# Patient Record
Sex: Male | Born: 1935 | Race: White | Hispanic: No | Marital: Married | Smoking: Never smoker
Health system: Southern US, Community
[De-identification: ages and names within clinical notes are randomized; demographics above are authoritative.]

## PROBLEM LIST (undated history)

## (undated) DIAGNOSIS — R7303 Prediabetes: Secondary | ICD-10-CM

## (undated) DIAGNOSIS — I10 Essential (primary) hypertension: Secondary | ICD-10-CM

## (undated) DIAGNOSIS — N183 Chronic kidney disease, stage 3 unspecified: Secondary | ICD-10-CM

## (undated) DIAGNOSIS — N400 Enlarged prostate without lower urinary tract symptoms: Secondary | ICD-10-CM

## (undated) HISTORY — PX: NASAL SEPTUM SURGERY: SHX37

## (undated) HISTORY — PX: TONSILLECTOMY: SUR1361

---

## 2016-01-18 ENCOUNTER — Other Ambulatory Visit: Payer: Self-pay | Admitting: Orthopedic Surgery

## 2016-04-07 NOTE — Pre-Procedure Instructions (Addendum)
Douglas Huffman  04/07/2016     No Pharmacies Listed   Your procedure is scheduled on 04/21/16  Report to [facility] at [phone].  Call this number if you have problems the morning of surgery:  [phone]   Remember:  Do not eat food or drink liquids after midnight.  Take these medicines the morning of surgery with A SIP OF WATER : NONE  STOP all herbel meds, nsaids (aleve,naproxen,advil,ibuprofen)7 days prior to surgery(04/14/16) including aspirin ,vitamins   Do not wear jewelry, make-up or nail polish.  Do not wear lotions, powders, or perfumes, or deoderant.  Do not shave 48 hours prior to surgery.  Men may shave face and neck.  Do not bring valuables to the hospital.  [facility] is not responsible for any belongings or valuables.  Contacts, dentures or bridgework may not be worn into surgery.  Leave your suitcase in the car.  After surgery it may be brought to your room.  For patients admitted to the hospital, discharge time will be determined by your treatment team.  Patients discharged the day of surgery will not be allowed to drive home.   Name and phone number of your driver:    Special instructions:  Special Instructions: [facility] - Preparing for Surgery  Before surgery, you can play an important role.  Because skin is not sterile, your skin needs to be as free of germs as possible.  You can reduce the number of germs on you skin by washing with CHG (chlorahexidine gluconate) soap before surgery.  CHG is an antiseptic cleaner which kills germs and bonds with the skin to continue killing germs even after washing.  Please DO NOT use if you have an allergy to CHG or antibacterial soaps.  If your skin becomes reddened/irritated stop using the CHG and inform your nurse when you arrive at Short Stay.  Do not shave (including legs and underarms) for at least 48 hours prior to the first CHG shower.  You may shave your face.  Please follow these  instructions carefully:   1.  Shower with CHG Soap the night before surgery and the morning of Surgery.  2.  If you choose to wash your hair, wash your hair first as usual with your normal shampoo.  3.  After you shampoo, rinse your hair and body thoroughly to remove the Shampoo.  4.  Use CHG as you would any other liquid soap.  You can apply chg directly  to the skin and wash gently with scrungie or a clean washcloth.  5.  Apply the CHG Soap to your body ONLY FROM THE NECK DOWN.  Do not use on open wounds or open sores.  Avoid contact with your eyes ears, mouth and genitals (private parts).  Wash genitals (private parts)       with your normal soap.  6.  Wash thoroughly, paying special attention to the area where your surgery will be performed.  7.  Thoroughly rinse your body with warm water from the neck down.  8.  DO NOT shower/wash with your normal soap after using and rinsing off the CHG Soap.  9.  Pat yourself dry with a clean towel.            10.  Wear clean pajamas.            11.  Place clean sheets on your bed the night of your first shower and do not sleep with pets.  Day of Surgery  Do not apply any lotions/deodorants the morning of surgery.  Please wear clean clothes to the hospital/surgery center.  Please read over the  fact sheets that you were given.

## 2016-04-08 ENCOUNTER — Encounter (HOSPITAL_COMMUNITY)
Admission: RE | Admit: 2016-04-08 | Discharge: 2016-04-08 | Disposition: A | Discharge status: Home / Self Care | Payer: Medicare Other | Source: Ambulatory Visit | Attending: Orthopedic Surgery | Admitting: Orthopedic Surgery

## 2016-04-08 ENCOUNTER — Ambulatory Visit (HOSPITAL_COMMUNITY)
Admission: RE | Admit: 2016-04-08 | Discharge: 2016-04-08 | Disposition: A | Discharge status: Home / Self Care | Payer: Medicare Other | Source: Ambulatory Visit | Attending: Orthopedic Surgery | Admitting: Orthopedic Surgery

## 2016-04-08 ENCOUNTER — Encounter (HOSPITAL_COMMUNITY): Payer: Self-pay

## 2016-04-08 DIAGNOSIS — Z01810 Encounter for preprocedural cardiovascular examination: Secondary | ICD-10-CM | POA: Diagnosis not present

## 2016-04-08 DIAGNOSIS — I129 Hypertensive chronic kidney disease with stage 1 through stage 4 chronic kidney disease, or unspecified chronic kidney disease: Secondary | ICD-10-CM | POA: Diagnosis not present

## 2016-04-08 DIAGNOSIS — I10 Essential (primary) hypertension: Secondary | ICD-10-CM | POA: Diagnosis not present

## 2016-04-08 DIAGNOSIS — N183 Chronic kidney disease, stage 3 (moderate): Secondary | ICD-10-CM | POA: Diagnosis not present

## 2016-04-08 DIAGNOSIS — R001 Bradycardia, unspecified: Secondary | ICD-10-CM | POA: Insufficient documentation

## 2016-04-08 DIAGNOSIS — Z01818 Encounter for other preprocedural examination: Secondary | ICD-10-CM | POA: Diagnosis present

## 2016-04-08 DIAGNOSIS — E1122 Type 2 diabetes mellitus with diabetic chronic kidney disease: Secondary | ICD-10-CM | POA: Insufficient documentation

## 2016-04-08 HISTORY — DX: Essential (primary) hypertension: I10

## 2016-04-08 HISTORY — DX: Chronic kidney disease, stage 3 (moderate): N18.3

## 2016-04-08 HISTORY — DX: Chronic kidney disease, stage 3 unspecified: N18.30

## 2016-04-08 HISTORY — DX: Prediabetes: R73.03

## 2016-04-08 HISTORY — DX: Benign prostatic hyperplasia without lower urinary tract symptoms: N40.0

## 2016-04-08 LAB — COMPREHENSIVE METABOLIC PANEL
ALBUMIN: 4.1 g/dL (ref 3.5–5.0)
ALK PHOS: 78 U/L (ref 38–126)
ALT: 14 U/L — ABNORMAL LOW (ref 17–63)
ANION GAP: 7 (ref 5–15)
AST: 16 U/L (ref 15–41)
BUN: 21 mg/dL — ABNORMAL HIGH (ref 6–20)
CO2: 26 mmol/L (ref 22–32)
Calcium: 9.5 mg/dL (ref 8.9–10.3)
Chloride: 106 mmol/L (ref 101–111)
Creatinine, Ser: 1.6 mg/dL — ABNORMAL HIGH (ref 0.61–1.24)
GFR calc Af Amer: 45 mL/min — ABNORMAL LOW (ref >60)
GFR calc non Af Amer: 39 mL/min — ABNORMAL LOW (ref >60)
GLUCOSE: 137 mg/dL — AB (ref 65–99)
POTASSIUM: 5 mmol/L (ref 3.5–5.1)
SODIUM: 139 mmol/L (ref 135–145)
Total Bilirubin: 1.3 mg/dL — ABNORMAL HIGH (ref 0.3–1.2)
Total Protein: 6.7 g/dL (ref 6.5–8.1)

## 2016-04-08 LAB — CBC WITH DIFFERENTIAL/PLATELET
BASOS ABS: 0 10*3/uL (ref 0.0–0.1)
BASOS PCT: 1 %
EOS ABS: 0.2 10*3/uL (ref 0.0–0.7)
Eosinophils Relative: 3 %
HCT: 48.2 % (ref 39.0–52.0)
HEMOGLOBIN: 16.5 g/dL (ref 13.0–17.0)
Lymphocytes Relative: 35 %
Lymphs Abs: 2.1 10*3/uL (ref 0.7–4.0)
MCH: 30.8 pg (ref 26.0–34.0)
MCHC: 34.2 g/dL (ref 30.0–36.0)
MCV: 89.9 fL (ref 78.0–100.0)
MONOS PCT: 10 %
Monocytes Absolute: 0.6 10*3/uL (ref 0.1–1.0)
NEUTROS PCT: 51 %
Neutro Abs: 3.2 10*3/uL (ref 1.7–7.7)
Platelets: 195 10*3/uL (ref 150–400)
RBC: 5.36 MIL/uL (ref 4.22–5.81)
RDW: 13.3 % (ref 11.5–15.5)
WBC: 6.1 10*3/uL (ref 4.0–10.5)

## 2016-04-08 LAB — URINALYSIS, ROUTINE W REFLEX MICROSCOPIC
Bilirubin Urine: NEGATIVE
Glucose, UA: NEGATIVE mg/dL
Hgb urine dipstick: NEGATIVE
Ketones, ur: NEGATIVE mg/dL
Leukocytes, UA: NEGATIVE
NITRITE: NEGATIVE
Protein, ur: NEGATIVE mg/dL
SPECIFIC GRAVITY, URINE: 1.015 (ref 1.005–1.030)
pH: 6 (ref 5.0–8.0)

## 2016-04-08 LAB — PROTIME-INR
INR: 1.08
Prothrombin Time: 14 seconds (ref 11.4–15.2)

## 2016-04-08 LAB — SURGICAL PCR SCREEN
MRSA, PCR: NEGATIVE
STAPHYLOCOCCUS AUREUS: POSITIVE — AB

## 2016-04-08 MED ORDER — CHLORHEXIDINE GLUCONATE 4 % EX LIQD: 60.0000 mL | Freq: Once | CUTANEOUS | Status: DC

## 2016-04-08 NOTE — Progress Notes (Addendum)
Anesthesia Chart Review:  Pt is an 82 year old male scheduled for R total knee arthroplasty on 04/21/2016 with Vickey Huger, MD.   - PCP is Antony Contras, MD who cleared pt for surgery at last office visit 01/14/16.   PMH includes:  HTN, pre-diabetes, CKD (stage 3). Never smoker. BMI 28.5  Preoperative labs reviewed.  Cr 1.6, BUN 21. Consistent with prior results from PCP's office 10/25/15: Cr 1.45, BUN 25.   CXR 04/08/16: No active cardiopulmonary disease.  EKG 04/08/16: Sinus bradycardia (49 bpm).   Willeen Cass, FNP-BC Physician [facility] Short Stay Surgical Center/Anesthesiology Phone: [phone] 04/10/2016 3:38 PM

## 2016-04-18 MED ORDER — TRANEXAMIC ACID 1000 MG/10ML IV SOLN
1000.0000 mg | INTRAVENOUS | Status: AC
  Administered 2016-04-21: 1000 mg via INTRAVENOUS
  Filled 2016-04-18: qty 10

## 2016-04-21 ENCOUNTER — Encounter (HOSPITAL_COMMUNITY): Payer: Self-pay | Admitting: *Deleted

## 2016-04-21 ENCOUNTER — Inpatient Hospital Stay (HOSPITAL_COMMUNITY): Payer: Medicare Other | Admitting: Vascular Surgery

## 2016-04-21 ENCOUNTER — Inpatient Hospital Stay (HOSPITAL_COMMUNITY)
Admission: RE | Admit: 2016-04-21 | Discharge: 2016-04-23 | DRG: 470 | Disposition: A | Discharge status: Home Health | Payer: Medicare Other | Source: Ambulatory Visit | Attending: Orthopedic Surgery | Admitting: Orthopedic Surgery

## 2016-04-21 ENCOUNTER — Encounter (HOSPITAL_COMMUNITY)
Admission: RE | Disposition: A | Discharge status: Home Health | Payer: Self-pay | Source: Ambulatory Visit | Attending: Orthopedic Surgery

## 2016-04-21 ENCOUNTER — Inpatient Hospital Stay (HOSPITAL_COMMUNITY): Payer: Medicare Other | Admitting: Anesthesiology

## 2016-04-21 DIAGNOSIS — Z7982 Long term (current) use of aspirin: Secondary | ICD-10-CM

## 2016-04-21 DIAGNOSIS — R338 Other retention of urine: Secondary | ICD-10-CM | POA: Diagnosis not present

## 2016-04-21 DIAGNOSIS — N183 Chronic kidney disease, stage 3 (moderate): Secondary | ICD-10-CM | POA: Diagnosis present

## 2016-04-21 DIAGNOSIS — Z96659 Presence of unspecified artificial knee joint: Secondary | ICD-10-CM

## 2016-04-21 DIAGNOSIS — Z885 Allergy status to narcotic agent status: Secondary | ICD-10-CM | POA: Diagnosis not present

## 2016-04-21 DIAGNOSIS — R7303 Prediabetes: Secondary | ICD-10-CM | POA: Diagnosis present

## 2016-04-21 DIAGNOSIS — N401 Enlarged prostate with lower urinary tract symptoms: Secondary | ICD-10-CM | POA: Diagnosis present

## 2016-04-21 DIAGNOSIS — I129 Hypertensive chronic kidney disease with stage 1 through stage 4 chronic kidney disease, or unspecified chronic kidney disease: Secondary | ICD-10-CM | POA: Diagnosis present

## 2016-04-21 DIAGNOSIS — M1711 Unilateral primary osteoarthritis, right knee: Principal | ICD-10-CM | POA: Diagnosis present

## 2016-04-21 DIAGNOSIS — M25561 Pain in right knee: Secondary | ICD-10-CM | POA: Diagnosis present

## 2016-04-21 HISTORY — PX: TOTAL KNEE ARTHROPLASTY: SHX125

## 2016-04-21 LAB — GLUCOSE, CAPILLARY: Glucose-Capillary: 109 mg/dL — ABNORMAL HIGH (ref 65–99)

## 2016-04-21 SURGERY — ARTHROPLASTY, KNEE, TOTAL
Anesthesia: Spinal | Site: Knee | Laterality: Right

## 2016-04-21 MED ORDER — PROPOFOL 500 MG/50ML IV EMUL
INTRAVENOUS | Status: DC | PRN
  Administered 2016-04-21: 75 ug/kg/min via INTRAVENOUS

## 2016-04-21 MED ORDER — PROPOFOL 500 MG/50ML IV EMUL
INTRAVENOUS | Status: AC
  Filled 2016-04-21: qty 50

## 2016-04-21 MED ORDER — SENNOSIDES-DOCUSATE SODIUM 8.6-50 MG PO TABS: 1.0000 | ORAL_TABLET | Freq: Every evening | ORAL | Status: DC | PRN

## 2016-04-21 MED ORDER — METHOCARBAMOL 500 MG PO TABS
500.0000 mg | ORAL_TABLET | Freq: Four times a day (QID) | ORAL | Status: DC | PRN
  Administered 2016-04-22: 500 mg via ORAL
  Filled 2016-04-21: qty 1

## 2016-04-21 MED ORDER — PROPOFOL 10 MG/ML IV BOLUS
INTRAVENOUS | Status: AC
  Filled 2016-04-21: qty 20

## 2016-04-21 MED ORDER — BUPIVACAINE HCL (PF) 0.25 % IJ SOLN
INTRAMUSCULAR | Status: AC
  Filled 2016-04-21: qty 30

## 2016-04-21 MED ORDER — ONDANSETRON HCL 4 MG/2ML IJ SOLN: 4.0000 mg | Freq: Four times a day (QID) | INTRAMUSCULAR | Status: DC | PRN

## 2016-04-21 MED ORDER — FENTANYL CITRATE (PF) 100 MCG/2ML IJ SOLN
INTRAMUSCULAR | Status: DC | PRN
  Administered 2016-04-21: 100 ug via INTRAVENOUS

## 2016-04-21 MED ORDER — MIDAZOLAM HCL 5 MG/5ML IJ SOLN
INTRAMUSCULAR | Status: DC | PRN
  Administered 2016-04-21: 2 mg via INTRAVENOUS

## 2016-04-21 MED ORDER — FLEET ENEMA 7-19 GM/118ML RE ENEM: 1.0000 | ENEMA | Freq: Once | RECTAL | Status: DC | PRN

## 2016-04-21 MED ORDER — ONDANSETRON HCL 4 MG/2ML IJ SOLN: 4.0000 mg | Freq: Once | INTRAMUSCULAR | Status: DC | PRN

## 2016-04-21 MED ORDER — HYDROMORPHONE HCL 2 MG/ML IJ SOLN
1.0000 mg | INTRAMUSCULAR | Status: DC | PRN
Start: 2016-04-21 — End: 2016-04-23

## 2016-04-21 MED ORDER — TRANEXAMIC ACID 1000 MG/10ML IV SOLN
1000.0000 mg | Freq: Once | INTRAVENOUS | Status: AC
  Administered 2016-04-21: 1000 mg via INTRAVENOUS
  Filled 2016-04-21: qty 10

## 2016-04-21 MED ORDER — CEFAZOLIN SODIUM-DEXTROSE 2-4 GM/100ML-% IV SOLN
2.0000 g | INTRAVENOUS | Status: AC
  Administered 2016-04-21: 2 g via INTRAVENOUS
  Filled 2016-04-21: qty 100

## 2016-04-21 MED ORDER — DIPHENHYDRAMINE HCL 12.5 MG/5ML PO ELIX: 12.5000 mg | ORAL_SOLUTION | ORAL | Status: DC | PRN

## 2016-04-21 MED ORDER — ALUM & MAG HYDROXIDE-SIMETH 200-200-20 MG/5ML PO SUSP
30.0000 mL | ORAL | Status: DC | PRN
  Administered 2016-04-22: 30 mL via ORAL
  Filled 2016-04-21: qty 30

## 2016-04-21 MED ORDER — ACETAMINOPHEN 325 MG PO TABS
650.0000 mg | ORAL_TABLET | Freq: Four times a day (QID) | ORAL | Status: DC | PRN
  Administered 2016-04-23: 650 mg via ORAL
  Filled 2016-04-21: qty 2

## 2016-04-21 MED ORDER — ACETAMINOPHEN 500 MG PO TABS
1000.0000 mg | ORAL_TABLET | Freq: Once | ORAL | Status: AC
  Administered 2016-04-21: 1000 mg via ORAL
  Filled 2016-04-21: qty 2

## 2016-04-21 MED ORDER — METOCLOPRAMIDE HCL 5 MG/ML IJ SOLN: 5.0000 mg | Freq: Three times a day (TID) | INTRAMUSCULAR | Status: DC | PRN

## 2016-04-21 MED ORDER — BUPIVACAINE-EPINEPHRINE 0.5% -1:200000 IJ SOLN
INTRAMUSCULAR | Status: DC | PRN
  Administered 2016-04-21: 20 mL

## 2016-04-21 MED ORDER — FENTANYL CITRATE (PF) 100 MCG/2ML IJ SOLN
INTRAMUSCULAR | Status: AC
  Filled 2016-04-21: qty 2

## 2016-04-21 MED ORDER — DEXAMETHASONE SODIUM PHOSPHATE 10 MG/ML IJ SOLN
INTRAMUSCULAR | Status: DC | PRN
  Administered 2016-04-21: 4 mg via INTRAVENOUS

## 2016-04-21 MED ORDER — LOSARTAN POTASSIUM 50 MG PO TABS
100.0000 mg | ORAL_TABLET | Freq: Every day | ORAL | Status: DC
  Administered 2016-04-21 – 2016-04-23 (×3): 100 mg via ORAL
  Filled 2016-04-21 (×3): qty 2

## 2016-04-21 MED ORDER — ASPIRIN EC 325 MG PO TBEC
325.0000 mg | DELAYED_RELEASE_TABLET | Freq: Two times a day (BID) | ORAL | Status: DC
  Administered 2016-04-21 – 2016-04-23 (×4): 325 mg via ORAL
  Filled 2016-04-21 (×5): qty 1

## 2016-04-21 MED ORDER — MIDAZOLAM HCL 2 MG/2ML IJ SOLN
INTRAMUSCULAR | Status: AC
  Filled 2016-04-21: qty 2

## 2016-04-21 MED ORDER — OXYCODONE HCL ER 10 MG PO T12A
10.0000 mg | EXTENDED_RELEASE_TABLET | Freq: Two times a day (BID) | ORAL | Status: DC
  Administered 2016-04-21 – 2016-04-22 (×2): 10 mg via ORAL
  Filled 2016-04-21 (×5): qty 1

## 2016-04-21 MED ORDER — CELECOXIB 200 MG PO CAPS
200.0000 mg | ORAL_CAPSULE | Freq: Two times a day (BID) | ORAL | Status: DC
  Administered 2016-04-21: 200 mg via ORAL
  Filled 2016-04-21 (×5): qty 1

## 2016-04-21 MED ORDER — LIDOCAINE 2% (20 MG/ML) 5 ML SYRINGE
INTRAMUSCULAR | Status: AC
  Filled 2016-04-21: qty 5

## 2016-04-21 MED ORDER — ONDANSETRON HCL 4 MG PO TABS: 4.0000 mg | ORAL_TABLET | Freq: Four times a day (QID) | ORAL | Status: DC | PRN

## 2016-04-21 MED ORDER — SODIUM CHLORIDE 0.9 % IJ SOLN
INTRAMUSCULAR | Status: DC | PRN
  Administered 2016-04-21: 20 mL

## 2016-04-21 MED ORDER — HYDROMORPHONE HCL 1 MG/ML IJ SOLN: 0.2500 mg | INTRAMUSCULAR | Status: DC | PRN

## 2016-04-21 MED ORDER — ONDANSETRON HCL 4 MG/2ML IJ SOLN
INTRAMUSCULAR | Status: DC | PRN
  Administered 2016-04-21: 4 mg via INTRAVENOUS

## 2016-04-21 MED ORDER — DOCUSATE SODIUM 100 MG PO CAPS
100.0000 mg | ORAL_CAPSULE | Freq: Two times a day (BID) | ORAL | Status: DC
  Administered 2016-04-21 – 2016-04-22 (×3): 100 mg via ORAL
  Filled 2016-04-21 (×5): qty 1

## 2016-04-21 MED ORDER — ACETAMINOPHEN 650 MG RE SUPP: 650.0000 mg | Freq: Four times a day (QID) | RECTAL | Status: DC | PRN

## 2016-04-21 MED ORDER — CEFAZOLIN IN D5W 1 GM/50ML IV SOLN
1.0000 g | Freq: Four times a day (QID) | INTRAVENOUS | Status: AC
  Administered 2016-04-21 (×2): 1 g via INTRAVENOUS
  Filled 2016-04-21 (×2): qty 50

## 2016-04-21 MED ORDER — OXYCODONE HCL 5 MG PO TABS
5.0000 mg | ORAL_TABLET | ORAL | Status: DC | PRN
  Administered 2016-04-21 – 2016-04-22 (×2): 10 mg via ORAL
  Filled 2016-04-21 (×3): qty 2

## 2016-04-21 MED ORDER — TAMSULOSIN HCL 0.4 MG PO CAPS
0.4000 mg | ORAL_CAPSULE | Freq: Every day | ORAL | Status: DC
  Administered 2016-04-21 – 2016-04-23 (×3): 0.4 mg via ORAL
  Filled 2016-04-21 (×3): qty 1

## 2016-04-21 MED ORDER — BUPIVACAINE LIPOSOME 1.3 % IJ SUSP
INTRAMUSCULAR | Status: DC | PRN
  Administered 2016-04-21: 20 mL

## 2016-04-21 MED ORDER — ZOLPIDEM TARTRATE 5 MG PO TABS: 5.0000 mg | ORAL_TABLET | Freq: Every evening | ORAL | Status: DC | PRN

## 2016-04-21 MED ORDER — BUPIVACAINE LIPOSOME 1.3 % IJ SUSP
20.0000 mL | INTRAMUSCULAR | Status: DC
  Filled 2016-04-21: qty 20

## 2016-04-21 MED ORDER — DEXAMETHASONE SODIUM PHOSPHATE 10 MG/ML IJ SOLN
10.0000 mg | Freq: Once | INTRAMUSCULAR | Status: AC
  Administered 2016-04-22: 10 mg via INTRAVENOUS
  Filled 2016-04-21: qty 1

## 2016-04-21 MED ORDER — SODIUM CHLORIDE 0.9 % IV SOLN
INTRAVENOUS | Status: DC
  Administered 2016-04-21 – 2016-04-22 (×2): via INTRAVENOUS

## 2016-04-21 MED ORDER — BISACODYL 5 MG PO TBEC
5.0000 mg | DELAYED_RELEASE_TABLET | Freq: Every day | ORAL | Status: DC | PRN
Start: 2016-04-21 — End: 2016-04-23

## 2016-04-21 MED ORDER — GABAPENTIN 300 MG PO CAPS
300.0000 mg | ORAL_CAPSULE | Freq: Three times a day (TID) | ORAL | Status: DC
  Administered 2016-04-21 – 2016-04-22 (×3): 300 mg via ORAL
  Filled 2016-04-21 (×6): qty 1

## 2016-04-21 MED ORDER — SODIUM CHLORIDE 0.9 % IV SOLN
INTRAVENOUS | Status: DC
  Administered 2016-04-21: 07:00:00 via INTRAVENOUS

## 2016-04-21 MED ORDER — SODIUM CHLORIDE 0.9 % IR SOLN
Status: DC | PRN
  Administered 2016-04-21: 3000 mL

## 2016-04-21 MED ORDER — EPHEDRINE SULFATE 50 MG/ML IJ SOLN
INTRAMUSCULAR | Status: DC | PRN
  Administered 2016-04-21: 10 mg via INTRAVENOUS
  Administered 2016-04-21: 5 mg via INTRAVENOUS

## 2016-04-21 MED ORDER — PROPOFOL 1000 MG/100ML IV EMUL
INTRAVENOUS | Status: AC
  Filled 2016-04-21: qty 100

## 2016-04-21 MED ORDER — MEPERIDINE HCL 25 MG/ML IJ SOLN: 6.2500 mg | INTRAMUSCULAR | Status: DC | PRN

## 2016-04-21 MED ORDER — MENTHOL 3 MG MT LOZG: 1.0000 | LOZENGE | OROMUCOSAL | Status: DC | PRN

## 2016-04-21 MED ORDER — METHOCARBAMOL 1000 MG/10ML IJ SOLN
500.0000 mg | Freq: Four times a day (QID) | INTRAVENOUS | Status: DC | PRN
  Filled 2016-04-21: qty 5

## 2016-04-21 MED ORDER — PHENOL 1.4 % MT LIQD: 1.0000 | OROMUCOSAL | Status: DC | PRN

## 2016-04-21 MED ORDER — ONDANSETRON HCL 4 MG/2ML IJ SOLN
INTRAMUSCULAR | Status: AC
  Filled 2016-04-21: qty 2

## 2016-04-21 MED ORDER — PROPOFOL 10 MG/ML IV BOLUS
INTRAVENOUS | Status: DC | PRN
  Administered 2016-04-21: 20 mg via INTRAVENOUS

## 2016-04-21 MED ORDER — EPHEDRINE 5 MG/ML INJ
INTRAVENOUS | Status: AC
  Filled 2016-04-21: qty 10

## 2016-04-21 MED ORDER — METOCLOPRAMIDE HCL 5 MG PO TABS: 5.0000 mg | ORAL_TABLET | Freq: Three times a day (TID) | ORAL | Status: DC | PRN

## 2016-04-21 MED ORDER — DEXAMETHASONE SODIUM PHOSPHATE 10 MG/ML IJ SOLN
INTRAMUSCULAR | Status: AC
  Filled 2016-04-21: qty 1

## 2016-04-21 MED ORDER — DUTASTERIDE 0.5 MG PO CAPS
0.5000 mg | ORAL_CAPSULE | Freq: Every day | ORAL | Status: DC
  Administered 2016-04-21 – 2016-04-23 (×3): 0.5 mg via ORAL
  Filled 2016-04-21 (×3): qty 1

## 2016-04-21 SURGICAL SUPPLY — 62 items
BANDAGE ACE 6X5 VEL STRL LF (GAUZE/BANDAGES/DRESSINGS) ×3 IMPLANT
BANDAGE ESMARK 6X9 LF (GAUZE/BANDAGES/DRESSINGS) ×1 IMPLANT
BLADE SAGITTAL 13X1.27X60 (BLADE) ×2 IMPLANT
BLADE SAGITTAL 13X1.27X60MM (BLADE) ×1
BLADE SAW SGTL 83.5X18.5 (BLADE) ×3 IMPLANT
BLADE SURG 10 STRL SS (BLADE) ×3 IMPLANT
BNDG ELASTIC 6X10 VLCR STRL LF (GAUZE/BANDAGES/DRESSINGS) ×3 IMPLANT
BNDG ESMARK 6X9 LF (GAUZE/BANDAGES/DRESSINGS) ×3
BOWL SMART MIX CTS (DISPOSABLE) ×3 IMPLANT
CAPT KNEE TOTAL 3 ×3 IMPLANT
CEMENT BONE SIMPLEX SPEEDSET (Cement) ×6 IMPLANT
CLOSURE WOUND 1/2 X4 (GAUZE/BANDAGES/DRESSINGS) ×1
COVER SURGICAL LIGHT HANDLE (MISCELLANEOUS) ×3 IMPLANT
CUFF TOURNIQUET SINGLE 34IN LL (TOURNIQUET CUFF) ×3 IMPLANT
DRAPE HALF SHEET 40X57 (DRAPES) ×3 IMPLANT
DRAPE INCISE IOBAN 66X45 STRL (DRAPES) ×6 IMPLANT
DRAPE PROXIMA HALF (DRAPES) IMPLANT
DRAPE U-SHAPE 47X51 STRL (DRAPES) ×3 IMPLANT
DRSG AQUACEL AG ADV 3.5X10 (GAUZE/BANDAGES/DRESSINGS) ×3 IMPLANT
DRSG PAD ABDOMINAL 8X10 ST (GAUZE/BANDAGES/DRESSINGS) ×3 IMPLANT
DURAPREP 26ML APPLICATOR (WOUND CARE) ×6 IMPLANT
ELECT REM PT RETURN 9FT ADLT (ELECTROSURGICAL) ×3
ELECTRODE REM PT RTRN 9FT ADLT (ELECTROSURGICAL) ×1 IMPLANT
GLOVE BIOGEL M 7.0 STRL (GLOVE) IMPLANT
GLOVE BIOGEL PI IND STRL 7.5 (GLOVE) IMPLANT
GLOVE BIOGEL PI IND STRL 8.5 (GLOVE) ×5 IMPLANT
GLOVE BIOGEL PI INDICATOR 7.5 (GLOVE)
GLOVE BIOGEL PI INDICATOR 8.5 (GLOVE) ×10
GLOVE SURG ORTHO 8.0 STRL STRW (GLOVE) ×18 IMPLANT
GOWN STRL REUS W/ TWL LRG LVL3 (GOWN DISPOSABLE) ×1 IMPLANT
GOWN STRL REUS W/ TWL XL LVL3 (GOWN DISPOSABLE) ×2 IMPLANT
GOWN STRL REUS W/TWL 2XL LVL3 (GOWN DISPOSABLE) ×3 IMPLANT
GOWN STRL REUS W/TWL LRG LVL3 (GOWN DISPOSABLE) ×2
GOWN STRL REUS W/TWL XL LVL3 (GOWN DISPOSABLE) ×4
HANDPIECE INTERPULSE COAX TIP (DISPOSABLE) ×2
HOOD PEEL AWAY FACE SHEILD DIS (HOOD) ×9 IMPLANT
KIT BASIN OR (CUSTOM PROCEDURE TRAY) ×3 IMPLANT
KIT ROOM TURNOVER OR (KITS) ×3 IMPLANT
KNEE CAPITATED TOTAL 3 ×1 IMPLANT
MANIFOLD NEPTUNE II (INSTRUMENTS) ×3 IMPLANT
NEEDLE 22X1 1/2 (OR ONLY) (NEEDLE) ×6 IMPLANT
NS IRRIG 1000ML POUR BTL (IV SOLUTION) ×3 IMPLANT
PACK TOTAL JOINT (CUSTOM PROCEDURE TRAY) ×3 IMPLANT
PACK UNIVERSAL I (CUSTOM PROCEDURE TRAY) ×3 IMPLANT
PAD ARMBOARD 7.5X6 YLW CONV (MISCELLANEOUS) ×6 IMPLANT
SET HNDPC FAN SPRY TIP SCT (DISPOSABLE) ×1 IMPLANT
STAPLER VISISTAT 35W (STAPLE) ×3 IMPLANT
STRIP CLOSURE SKIN 1/2X4 (GAUZE/BANDAGES/DRESSINGS) ×2 IMPLANT
SUCTION FRAZIER HANDLE 10FR (MISCELLANEOUS) ×2
SUCTION TUBE FRAZIER 10FR DISP (MISCELLANEOUS) ×1 IMPLANT
SUT BONE WAX W31G (SUTURE) ×3 IMPLANT
SUT VIC AB 0 CTB1 27 (SUTURE) ×6 IMPLANT
SUT VIC AB 1 CT1 27 (SUTURE) ×4
SUT VIC AB 1 CT1 27XBRD ANBCTR (SUTURE) ×2 IMPLANT
SUT VIC AB 2-0 CT1 27 (SUTURE) ×4
SUT VIC AB 2-0 CT1 TAPERPNT 27 (SUTURE) ×2 IMPLANT
SYR 20CC LL (SYRINGE) ×6 IMPLANT
TOWEL OR 17X24 6PK STRL BLUE (TOWEL DISPOSABLE) ×3 IMPLANT
TOWEL OR 17X26 10 PK STRL BLUE (TOWEL DISPOSABLE) ×3 IMPLANT
TRAY CATH 16FR W/PLASTIC CATH (SET/KITS/TRAYS/PACK) ×3 IMPLANT
WATER STERILE IRR 1000ML POUR (IV SOLUTION) ×6 IMPLANT
WRAP KNEE MAXI GEL POST OP (GAUZE/BANDAGES/DRESSINGS) ×3 IMPLANT

## 2016-04-21 NOTE — Progress Notes (Signed)
Patient states that he is having a hard time urinating due to enlarged prostate after surgery. Out put was 75. Nurse tech to Bladder scan and In and out patient.  Also more reinforcement to the patient's ace bandage to his surgical incision. Blood noted under the incision. Nurse will continue to monitor.

## 2016-04-21 NOTE — Anesthesia Postprocedure Evaluation (Signed)
Anesthesia Post Note  Patient: Douglas Huffman  Procedure(s) Performed: Procedure(s) (LRB): Right TOTAL KNEE ARTHROPLASTY (Right)  Patient location during evaluation: PACU Anesthesia Type: Spinal Level of consciousness: oriented and awake and alert Pain management: pain level controlled Vital Signs Assessment: post-procedure vital signs reviewed and stable Respiratory status: spontaneous breathing, respiratory function stable and patient connected to nasal cannula oxygen Cardiovascular status: blood pressure returned to baseline and stable Postop Assessment: no headache and no backache Anesthetic complications: no    Last Vitals:  Vitals:   04/21/16 1100 04/21/16 1130  BP:  127/60  Pulse: (!) 48 77  Resp: 12 18  Temp:  36.3 C    Last Pain:  Vitals:   04/21/16 1327  TempSrc:   PainSc: 1                  , DAVID

## 2016-04-21 NOTE — Evaluation (Signed)
Physical Therapy Evaluation Patient Details Name: Douglas Huffman MRN: [id number] DOB: January 22, 1933 Today's Date: 04/21/2016   History of Present Illness  Pt presents for right TKA with PMH: enlarged prostate, CKD, HTN, OA.  Clinical Impression  Pt is s/p TKA resulting in the deficits listed below (see PT Problem List). Pt ambulated 6' with RW and min A, moving RLE very well this afternoon. Noted bleeding through dressing end of session, RN notified and present.  Pt will benefit from skilled PT to increase their independence and safety with mobility to allow discharge to the venue listed below.      Follow Up Recommendations Home health PT    Equipment Recommendations  None recommended by PT    Recommendations for Other Services       Precautions / Restrictions Precautions Precautions: Knee Precaution Booklet Issued: No Precaution Comments: reviewed proper positioning including use of zero knee foam Restrictions Weight Bearing Restrictions: Yes RLE Weight Bearing: Weight bearing as tolerated      Mobility  Bed Mobility Overal bed mobility: Needs Assistance Bed Mobility: Supine to Sit     Supine to sit: Supervision     General bed mobility comments: vc's for sequencing. Today pt able to sit straight up and swing both legs over edge of bed, discussed how to get out of bed if knee becomes more stiff next few days  Transfers Overall transfer level: Needs assistance Equipment used: Rolling walker (2 wheeled) Transfers: Sit to/from Stand Sit to Stand: Supervision         General transfer comment: vc's for hand placement, pt not familiar with use of RW.   Ambulation/Gait Ambulation/Gait assistance: Min assist Ambulation Distance (Feet): 50 Feet Assistive device: Rolling walker (2 wheeled) Gait Pattern/deviations: Step-through pattern;Decreased weight shift to right;Trunk flexed Gait velocity: decreased Gait velocity interpretation: Below normal speed for  age/gender General Gait Details: vc's for erect posture, min A given due to pt still with sensory deficits from surgery. No knee buckling R  Stairs            Wheelchair Mobility    Modified Rankin (Stroke Patients Only)       Balance Overall balance assessment: No apparent balance deficits (not formally assessed)                                           Pertinent Vitals/Pain Pain Assessment: No/denies pain    Home Living Family/patient expects to be discharged to:: Private residence Living Arrangements: Spouse/significant other Available Help at Discharge: Family;Available 24 hours/day Type of Home: Independent living facility Home Access: Level entry     Home Layout: One level Home Equipment: Walker - standard Additional Comments: pt lives at [facility]    Prior Function Level of Independence: Independent         Comments: pt works out in gym everyday, very active     Journalist, newspaper        Extremity/Trunk Assessment   Upper Extremity Assessment: Overall WFL for tasks assessed           Lower Extremity Assessment: RLE deficits/detail RLE Deficits / Details: LLE WFL, R ROM 0-95 degrees, right knee ext 4/5, hip flex 4/5, knee flex 4/5, numbness bilateral LE's since surgery posterior thighs    Cervical / Trunk Assessment: Normal  Communication   Communication: No difficulties  Cognition Arousal/Alertness: Awake/alert Behavior During Therapy: [facility]  for tasks assessed/performed Overall Cognitive Status: Within Functional Limits for tasks assessed                      General Comments General comments (skin integrity, edema, etc.): pt with oozing through bandage superior portion of incision, RN notified and present to reinforce    Exercises Total Joint Exercises Ankle Circles/Pumps: AROM;Both;20 reps;Seated Quad Sets: AROM;Both;10 reps;Seated Straight Leg Raises: AROM;Right;5 reps;Supine Long Arc Quad:  AROM;Right;10 reps;Seated   Assessment/Plan    PT Assessment Patient needs continued PT services  PT Problem List Decreased strength;Decreased range of motion;Decreased mobility;Decreased knowledge of use of DME;Decreased knowledge of precautions;Impaired sensation          PT Treatment Interventions DME instruction;Gait training;Stair training;Functional mobility training;Therapeutic activities;Therapeutic exercise;Balance training;Patient/family education;Neuromuscular re-education    PT Goals (Current goals can be found in the Care Plan section)  Acute Rehab PT Goals Patient Stated Goal: return to working out PT Goal Formulation: With patient Time For Goal Achievement: 04/28/16 Potential to Achieve Goals: Good    Frequency 7X/week   Barriers to discharge        Co-evaluation               End of Session Equipment Utilized During Treatment: Gait belt Activity Tolerance: Patient tolerated treatment well Patient left: in chair;with call bell/phone within reach;with family/visitor present Nurse Communication: Mobility status         Time: 1244-1320 PT Time Calculation (min) (ACUTE ONLY): 36 min   Charges:   PT Evaluation $PT Eval Low Complexity: 1 Procedure PT Treatments $Gait Training: 8-22 mins   PT G Codes:       Leighton Roach, PT  Acute Rehab Services  [place], [place] 04/21/2016, 1:33 PM

## 2016-04-21 NOTE — Anesthesia Procedure Notes (Signed)
Procedure Name: MAC Performed by: Terrance Mass Pre-anesthesia Checklist: Patient identified, Emergency Drugs available, Suction available, Patient being monitored and Timeout performed Patient Re-evaluated:Patient Re-evaluated prior to inductionOxygen Delivery Method: Simple face mask

## 2016-04-21 NOTE — H&P (Signed)
Douglas Huffman MRN:  [id number] DOB/SEX:  05-12-1933/male  CHIEF COMPLAINT:  Painful right Knee  HISTORY: Patient is a 82 y.o. male presented with a history of pain in the left knee. Onset of symptoms was gradual starting a few years ago with gradually worsening course since that time. Patient has been treated conservatively with over-the-counter NSAIDs and activity modification. Patient currently rates pain in the knee at 10 out of 10 with activity. There is pain at night.  PAST MEDICAL HISTORY: There are no active problems to display for this patient.  Past Medical History:  Diagnosis Date  . CKD (chronic kidney disease), stage III   . Enlarged prostate   . Hypertension   . Pre-diabetes    Past Surgical History:  Procedure Laterality Date  . NASAL SEPTUM SURGERY    . TONSILLECTOMY       MEDICATIONS:   Prescriptions Prior to Admission  Medication Sig Dispense Refill Last Dose  . aspirin 325 MG tablet Take 162.5-325 mg by mouth daily.   04/20/2016 at Unknown time  . dutasteride (AVODART) 0.5 MG capsule Take 0.5 mg by mouth daily.   04/20/2016 at Unknown time  . losartan (COZAAR) 100 MG tablet Take 100 mg by mouth daily.   04/20/2016 at Unknown time  . oxymetazoline (AFRIN) 0.05 % nasal spray Place 1 spray into both nostrils at bedtime.   04/20/2016 at Unknown time  . tamsulosin (FLOMAX) 0.4 MG CAPS capsule Take 0.4 mg by mouth daily after supper.   04/20/2016 at Unknown time  . ibuprofen (ADVIL,MOTRIN) 200 MG tablet Take 200 mg by mouth every 6 (six) hours as needed for mild pain.   Unknown at Unknown time    ALLERGIES:   Allergies  Allergen Reactions  . Demerol [Meperidine] Other (See Comments)    Felt like needles coming through skin - also upset stomach    REVIEW OF SYSTEMS:  A comprehensive review of systems was negative except for: Musculoskeletal: positive for bone pain   FAMILY HISTORY:  History reviewed. No pertinent family history.  SOCIAL HISTORY:   Social  History  Substance Use Topics  . Smoking status: Never Smoker  . Smokeless tobacco: Never Used  . Alcohol use No     EXAMINATION:  Vital signs in last 24 hours: Temp:  [97.7 F (36.5 C)] 97.7 F (36.5 C) (10/30 [id number]) Pulse Rate:  [54] 54 (10/30 0614) Resp:  [20] 20 (10/30 0614) BP: (169)/(64) 169/64 (10/30 0614) SpO2:  [100 %] 100 % (10/30 0614) Weight:  [90.2 kg (198 lb 13 oz)] 90.2 kg (198 lb 13 oz) (10/30 0614)  BP (!) 169/64   Pulse (!) 54   Temp 97.7 F (36.5 C) (Oral)   Resp 20   Ht 5\' 10"  (1.778 m)   Wt 90.2 kg (198 lb 13 oz)   SpO2 100%   BMI 28.53 kg/m   General Appearance:    Alert, cooperative, no distress, appears stated age  Head:    Normocephalic, without obvious abnormality, atraumatic  Eyes:    PERRL, conjunctiva/corneas clear, EOM's intact, fundi    benign, both eyes       Ears:    Normal TM's and external ear canals, both ears  Nose:   Nares normal, septum midline, mucosa normal, no drainage    or sinus tenderness  Throat:   Lips, mucosa, and tongue normal; teeth and gums normal  Neck:   Supple, symmetrical, trachea midline, no adenopathy;       thyroid:  No enlargement/tenderness/nodules; no carotid   bruit or JVD  Back:     Symmetric, no curvature, ROM normal, no CVA tenderness  Lungs:     Clear to auscultation bilaterally, respirations unlabored  Chest wall:    No tenderness or deformity  Heart:    Regular rate and rhythm, S1 and S2 normal, no murmur, rub   or gallop  Abdomen:     Soft, non-tender, bowel sounds active all four quadrants,    no masses, no organomegaly  Genitalia:    Normal male without lesion, discharge or tenderness  Rectal:    Normal tone, normal prostate, no masses or tenderness;   guaiac negative stool  Extremities:   Extremities normal, atraumatic, no cyanosis or edema  Pulses:   2+ and symmetric all extremities  Skin:   Skin color, texture, turgor normal, no rashes or lesions  Lymph nodes:   Cervical, supraclavicular, and  axillary nodes normal  Neurologic:   CNII-XII intact. Normal strength, sensation and reflexes      throughout     Musculoskeletal:  ROM 0-120, Ligaments intact,  Imaging Review Plain radiographs demonstrate severe degenerative joint disease of the right knee. The overall alignment is neutral. The bone quality appears to be excellent for age and reported activity level.  Assessment/Plan: Primary osteoarthritis, right knee   The patient history, physical examination and imaging studies are consistent with advanced degenerative joint disease of the right knee. The patient has failed conservative treatment.  The clearance notes were reviewed.  After discussion with the patient it was felt that Total Knee Replacement was indicated. The procedure,  risks, and benefits of total knee arthroplasty were presented and reviewed. The risks including but not limited to aseptic loosening, infection, blood clots, vascular injury, stiffness, patella tracking problems complications among others were discussed. The patient acknowledged the explanation, agreed to proceed with the plan. Donia Ast 04/21/2016, 6:41 AM

## 2016-04-21 NOTE — Progress Notes (Signed)
Patient's dressing had a moderate amount of bleeding. Nurse re-enforced dressing with two ABD pads and another ACE wrap. PA notified  A Archivist, RN

## 2016-04-21 NOTE — Anesthesia Preprocedure Evaluation (Signed)
Anesthesia Evaluation  Patient identified by MRN, date of birth, ID band Patient awake    Reviewed: Allergy & Precautions, NPO status , Patient's Chart, lab work & pertinent test results  Airway Mallampati: I  TM Distance: >3 FB Neck ROM: Full    Dental   Pulmonary    Pulmonary exam normal        Cardiovascular hypertension, Normal cardiovascular exam     Neuro/Psych    GI/Hepatic   Endo/Other    Renal/GU Renal InsufficiencyRenal disease     Musculoskeletal   Abdominal   Peds  Hematology   Anesthesia Other Findings   Reproductive/Obstetrics                             Anesthesia Physical Anesthesia Plan  ASA: II  Anesthesia Plan: Spinal   Post-op Pain Management:    Induction: Intravenous  Airway Management Planned: Simple Face Mask  Additional Equipment:   Intra-op Plan:   Post-operative Plan:   Informed Consent: I have reviewed the patients History and Physical, chart, labs and discussed the procedure including the risks, benefits and alternatives for the proposed anesthesia with the patient or authorized representative who has indicated his/her understanding and acceptance.     Plan Discussed with: CRNA and Surgeon  Anesthesia Plan Comments:         Anesthesia Quick Evaluation

## 2016-04-21 NOTE — Transfer of Care (Signed)
Immediate Anesthesia Transfer of Care Note  Patient: Douglas Huffman  Procedure(s) Performed: Procedure(s): Right TOTAL KNEE ARTHROPLASTY (Right)  Patient Location: PACU  Anesthesia Type:MAC and Spinal  Level of Consciousness: awake and sedated  Airway & Oxygen Therapy: Patient Spontanous Breathing and Patient connected to face mask oxygen  Post-op Assessment: Report given to RN and Post -op Vital signs reviewed and stable  Post vital signs: Reviewed and stable  Last Vitals:  Vitals:   04/21/16 0614  BP: (!) 169/64  Pulse: (!) 54  Resp: 20  Temp: 36.5 C    Last Pain:  Vitals:   04/21/16 0614  TempSrc: Oral      Patients Stated Pain Goal: 4 ([id number])  Complications: No apparent anesthesia complications

## 2016-04-21 NOTE — Progress Notes (Signed)
Orthopedic Tech Progress Note Patient Details:  Douglas Huffman 01-26-1933 [id number] OHF w/ Trapeze. CPM Right Knee CPM Right Knee: On Right Knee Flexion (Degrees): 90 Right Knee Extension (Degrees): 0  Ortho Devices Ortho Device/Splint Location: Footsie Roll Ortho Device/Splint Interventions: Berenice Primas 04/21/2016, 10:17 AM

## 2016-04-22 ENCOUNTER — Encounter (HOSPITAL_COMMUNITY): Payer: Self-pay | Admitting: Orthopedic Surgery

## 2016-04-22 LAB — BASIC METABOLIC PANEL
ANION GAP: 5 (ref 5–15)
BUN: 20 mg/dL (ref 6–20)
CHLORIDE: 109 mmol/L (ref 101–111)
CO2: 24 mmol/L (ref 22–32)
Calcium: 8.6 mg/dL — ABNORMAL LOW (ref 8.9–10.3)
Creatinine, Ser: 1.46 mg/dL — ABNORMAL HIGH (ref 0.61–1.24)
GFR, EST AFRICAN AMERICAN: 50 mL/min — AB (ref >60)
GFR, EST NON AFRICAN AMERICAN: 43 mL/min — AB (ref >60)
Glucose, Bld: 140 mg/dL — ABNORMAL HIGH (ref 65–99)
POTASSIUM: 4.5 mmol/L (ref 3.5–5.1)
SODIUM: 138 mmol/L (ref 135–145)

## 2016-04-22 LAB — CBC
HCT: 40 % (ref 39.0–52.0)
HEMOGLOBIN: 13.5 g/dL (ref 13.0–17.0)
MCH: 30.2 pg (ref 26.0–34.0)
MCHC: 33.8 g/dL (ref 30.0–36.0)
MCV: 89.5 fL (ref 78.0–100.0)
PLATELETS: 183 10*3/uL (ref 150–400)
RBC: 4.47 MIL/uL (ref 4.22–5.81)
RDW: 13 % (ref 11.5–15.5)
WBC: 15.1 10*3/uL — AB (ref 4.0–10.5)

## 2016-04-22 MED ORDER — METHOCARBAMOL 500 MG PO TABS: 500.0000 mg | ORAL_TABLET | Freq: Four times a day (QID) | ORAL | 0 refills | Status: AC | PRN

## 2016-04-22 MED ORDER — ASPIRIN 325 MG PO TBEC: 325.0000 mg | DELAYED_RELEASE_TABLET | Freq: Two times a day (BID) | ORAL | 0 refills | Status: AC

## 2016-04-22 MED ORDER — OXYCODONE HCL 5 MG PO TABS: 5.0000 mg | ORAL_TABLET | ORAL | 0 refills | Status: AC | PRN

## 2016-04-22 NOTE — Progress Notes (Signed)
Physical Therapy Treatment Patient Details Name: Douglas Huffman MRN: [id number] DOB: 12/04/1932 Today's Date: 04/22/2016    History of Present Illness Pt presents for right TKA with PMH: enlarged prostate, CKD, HTN, OA.    PT Comments    Patient continues to progress with mobility. Stair training and increased gait distance this session. Current plan remains appropriate.   Follow Up Recommendations  Home health PT     Equipment Recommendations  None recommended by PT    Recommendations for Other Services       Precautions / Restrictions Precautions Precautions: Knee Precaution Booklet Issued: No Precaution Comments: pt has good understanding of precuations Restrictions Weight Bearing Restrictions: Yes RLE Weight Bearing: Weight bearing as tolerated    Mobility  Bed Mobility Overal bed mobility: Modified Independent Bed Mobility: Supine to Sit           General bed mobility comments: pt OOB in chair upon arrival  Transfers Overall transfer level: Needs assistance Equipment used: Rolling walker (2 wheeled) Transfers: Sit to/from Stand Sit to Stand: Supervision         General transfer comment: carry over of safe hand placement  Ambulation/Gait Ambulation/Gait assistance: Supervision Ambulation Distance (Feet): 300 Feet Assistive device: Rolling walker (2 wheeled) Gait Pattern/deviations: Step-through pattern;Decreased stance time - right;Decreased stride length;Decreased weight shift to right     General Gait Details: cues for cadence and proximity of RW; pt with improved WB on R LE; slightly antalgic gait but steady; reliant on RW for balance    Stairs Stairs: Yes Stairs assistance: Min guard Stair Management: Two rails;Forwards Number of Stairs: 2 General stair comments: educated on sequencing and technique  Wheelchair Mobility    Modified Rankin (Stroke Patients Only)       Balance                                     Cognition Arousal/Alertness: Awake/alert Behavior During Therapy: WFL for tasks assessed/performed Overall Cognitive Status: Within Functional Limits for tasks assessed                      Exercises Total Joint Exercises Quad Sets: AROM;Both;10 reps;Supine Heel Slides: AROM;Right;10 reps;Supine Straight Leg Raises: AROM;Right;Supine;10 reps Long Arc Quad: AROM;Right;10 reps;Seated Goniometric ROM: 5-92    General Comments General comments (skin integrity, edema, etc.): wife present for session      Pertinent Vitals/Pain Pain Assessment: Faces Pain Score: 2  Faces Pain Scale: Hurts a little bit Pain Location: R thigh/knee Pain Descriptors / Indicators: Sore Pain Intervention(s): Limited activity within patient's tolerance;Monitored during session;Premedicated before session;Repositioned    Home Living Family/patient expects to be discharged to:: Private residence Living Arrangements: Spouse/significant other Available Help at Discharge: Family;Available 24 hours/day Type of Home: Independent living facility Home Access: Level entry   Home Layout: One level Home Equipment: Walker - standard;Shower seat - built in;Grab bars - toilet;Grab bars - tub/shower;Hand held shower head Additional Comments: pt lives at [facility]    Prior Function Level of Independence: Independent      Comments: pt works out in gym everyday, very active   PT Goals (current goals can now be found in the care plan section) Acute Rehab PT Goals Patient Stated Goal: go home PT Goal Formulation: With patient Time For Goal Achievement: 04/28/16 Potential to Achieve Goals: Good Progress towards PT goals: Progressing toward goals    Frequency  7X/week      PT Plan Current plan remains appropriate    Co-evaluation             End of Session Equipment Utilized During Treatment: Gait belt Activity Tolerance: Patient tolerated treatment well Patient left: in chair;with  call bell/phone within reach;with family/visitor present     Time: 1445-1501 PT Time Calculation (min) (ACUTE ONLY): 16 min  Charges:  $Gait Training: 8-22 mins                     G Codes:      Salina April, PTA Pager: [phone]   04/22/2016, 3:07 PM

## 2016-04-22 NOTE — Op Note (Signed)
TOTAL KNEE REPLACEMENT OPERATIVE NOTE:  04/21/2016  11:12 AM  PATIENT:  Douglas Huffman  82 y.o. male  PRE-OPERATIVE DIAGNOSIS:  primary osteoarthritis right knee  POST-OPERATIVE DIAGNOSIS:  primary osteoarthritis right knee  PROCEDURE:  Procedure(s): Right TOTAL KNEE ARTHROPLASTY  SURGEON:  Surgeon(s): Vickey Huger, MD  PHYSICIAN ASSISTANT: Carlyon Shadow, PAC  ANESTHESIA:   spinal  DRAINS: Hemovac  SPECIMEN: None  COUNTS:  Correct  TOURNIQUET:   Total Tourniquet Time Documented: Thigh (Right) - 42 minutes Total: Thigh (Right) - 42 minutes   DICTATION:  Indication for procedure:    The patient is a 82 y.o. male who has failed conservative treatment for primary osteoarthritis right knee.  Informed consent was obtained prior to anesthesia. The risks versus benefits of the operation were explain and in a way the patient can, and did, understand.   On the implant demand matching protocol, this patient scored 8.  Therefore, this patient was not receive a polyethylene insert with vitamin E which is a high demand implant.  Description of procedure:     The patient was taken to the operating room and placed under anesthesia.  The patient was positioned in the usual fashion taking care that all body parts were adequately padded and/or protected.  I foley catheter was not placed.  A tourniquet was applied and the leg prepped and draped in the usual sterile fashion.  The extremity was exsanguinated with the esmarch and tourniquet inflated to 350 mmHg.  Pre-operative range of motion was normal.  The knee was in 8 degree of significant varus.  A midline incision approximately 6-7 inches long was made with a #10 blade.  A new blade was used to make a parapatellar arthrotomy going 2-3 cm into the quadriceps tendon, over the patella, and alongside the medial aspect of the patellar tendon.  A synovectomy was then performed with the #10 blade and forceps. I then elevated the deep MCL off the  medial tibial metaphysis subperiosteally around to the semimembranosus attachment.    I everted the patella and used calipers to measure patellar thickness.  I used the reamer to ream down to appropriate thickness to recreate the native thickness.  I then removed excess bone with the rongeur and sagittal saw.  I used the appropriately sized template and drilled the three lug holes.  I then put the trial in place and measured the thickness with the calipers to ensure recreation of the native thickness.  The trial was then removed and the patella subluxed and the knee brought into flexion.  A homan retractor was place to retract and protect the patella and lateral structures.  A Z-retractor was place medially to protect the medial structures.  The extra-medullary alignment system was used to make cut the tibial articular surface perpendicular to the anamotic axis of the tibia and in 3 degrees of posterior slope.  The cut surface and alignment jig was removed.  I then used the intramedullary alignment guide to make a 6 valgus cut on the distal femur.  I then marked out the epicondylar axis on the distal femur.  The posterior condylar axis measured 3 degrees.  I then used the anterior referencing sizer and measured the femur to be a size 11.  The 4-In-1 cutting block was screwed into place in external rotation matching the posterior condylar angle, making our cuts perpendicular to the epicondylar axis.  Anterior, posterior and chamfer cuts were made with the sagittal saw.  The cutting block and cut pieces  were removed.  A lamina spreader was placed in 90 degrees of flexion.  The ACL, PCL, menisci, and posterior condylar osteophytes were removed.  A 16 mm spacer blocked was found to offer good flexion and extension gap balance after mild in degree releasing.   The scoop retractor was then placed and the femoral finishing block was pinned in place.  The small sagittal saw was used as well as the lug drill to  finish the femur.  The block and cut surfaces were removed and the medullary canal hole filled with autograft bone from the cut pieces.  The tibia was delivered forward in deep flexion and external rotation.  A size F tray was selected and pinned into place centered on the medial 1/3 of the tibial tubercle.  The reamer and keel was used to prepare the tibia through the tray.    I then trialed with the size 11 femur, size F tibia, a 16 mm insert and the 38 patella.  I had excellent flexion/extension gap balance, excellent patella tracking.  Flexion was full and beyond 120 degrees; extension was zero.  These components were chosen and the staff opened them to me on the back table while the knee was lavaged copiously and the cement mixed.  The soft tissue was infiltrated with 60cc of exparel 1.3% through a 21 gauge needle.  I cemented in the components and removed all excess cement.  The polyethylene tibial component was snapped into place and the knee placed in extension while cement was hardening.  The capsule was infilltrated with 30cc of .25% Marcaine with epinephrine.  A hemovac was place in the joint exiting superolaterally.  A pain pump was place superomedially superficial to the arthrotomy.  Once the cement was hard, the tourniquet was let down.  Hemostasis was obtained.  The arthrotomy was closed with figure-8 #1 vicryl sutures.  The deep soft tissues were closed with #0 vicryls and the subcuticular layer closed with a running #2-0 vicryl.  The skin was reapproximated and closed with skin staples.  The wound was dressed with xeroform, 4 x4's, 2 ABD sponges, a single layer of webril and a TED stocking.   The patient was then awakened, extubated, and taken to the recovery room in stable condition.  BLOOD LOSS:  300cc DRAINS: 1 hemovac, 1 pain catheter COMPLICATIONS:  None.  PLAN OF CARE: Admit to inpatient   PATIENT DISPOSITION:  PACU - hemodynamically stable.   Delay start of Pharmacological  VTE agent (>24hrs) due to surgical blood loss or risk of bleeding:  not applicable  Please fax a copy of this op note to my office at [phone] (please only include page 1 and 2 of the Case Information op note)

## 2016-04-22 NOTE — Progress Notes (Signed)
SPORTS MEDICINE AND JOINT REPLACEMENT  Lara Mulch, MD    Carlyon Shadow, PA-C [address], [place], [place]  [address]                             [phone]   PROGRESS NOTE  Subjective:  negative for Chest Pain  negative for Shortness of Breath  negative for Nausea/Vomiting   negative for Calf Pain  negative for Bowel Movement   Tolerating Diet: yes         Patient reports pain as 4 on 0-10 scale.    Objective: Vital signs in last 24 hours:   Patient Vitals for the past 24 hrs:  BP Temp Temp src Pulse Resp SpO2  04/22/16 0012 133/74 97.1 F (36.2 C) Oral 65 18 96 %  04/21/16 1946 (!) 139/59 97.7 F (36.5 C) Oral (!) 55 18 96 %  04/21/16 1130 127/60 97.4 F (36.3 C) Oral 77 18 98 %  04/21/16 1100 - - - (!) 48 12 98 %  04/21/16 1045 - - - (!) 55 13 98 %  04/21/16 1030 - - - (!) 47 10 98 %  04/21/16 1022 128/60 - - 68 16 100 %  04/21/16 1015 - - - (!) 48 12 98 %  04/21/16 1014 97/66 - - - (!) 147 -  04/21/16 1000 122/60 - - (!) 48 11 97 %  04/21/16 0945 (!) 111/55 - - (!) 57 12 99 %  04/21/16 0930 - - - 66 17 98 %  04/21/16 0922 (!) 119/56 97.2 F (36.2 C) - - - -    @flow {1959:LAST@   Intake/Output from previous day:   10/30 0701 - 10/31 0700 In: 1040 [P.O.:240; I.V.:800] Out: [id number] [Urine:3375]   Intake/Output this shift:   No intake/output data recorded.   Intake/Output      10/30 0701 - 10/31 0700 10/31 0701 - 11/01 0700   P.O. 240    I.V. (mL/kg) 800 (8.9)    Total Intake(mL/kg) 1040 (11.5)    Urine (mL/kg/hr) 3375 (1.6)    Blood 50 (0)    Total Output 3425     Net -2385             LABORATORY DATA:  Recent Labs  04/22/16 0603  WBC 15.1*  HGB 13.5  HCT 40.0  PLT 183   No results for input(s): NA, K, CL, CO2, BUN, CREATININE, GLUCOSE, CALCIUM in the last 168 hours. Lab Results  Component Value Date   INR 1.08 04/08/2016    Examination:  General appearance: alert, cooperative and no distress Extremities: extremities normal,  atraumatic, no cyanosis or edema  Wound Exam: clean, dry, intact   Drainage:  Scant/small amount Bloody exudate  Motor Exam: Quadriceps and Hamstrings Intact  Sensory Exam: Superficial Peroneal, Deep Peroneal and Tibial normal   Assessment:    1 Day Post-Op  Procedure(s) (LRB): Right TOTAL KNEE ARTHROPLASTY (Right)  ADDITIONAL DIAGNOSIS:  Active Problems:   S/P total knee replacement  Urinary Retention   Plan: Physical Therapy as ordered Weight Bearing as Tolerated (WBAT)  DVT Prophylaxis:  Aspirin  DISCHARGE PLAN: Home  DISCHARGE NEEDS: HHPT   Patient is doing well, he is experiencing some urinary retention due to prostate issues. He has had a straight cath x3. Will continue to follow, from a knee standpoint he can be D/C today. Will continue to monitor his urinary retention.  Donia Ast 04/22/2016, 7:12 AM

## 2016-04-22 NOTE — Progress Notes (Signed)
Physical Therapy Treatment Patient Details Name: Douglas Huffman MRN: [id number] DOB: 1933-06-16 Today's Date: 04/22/2016    History of Present Illness Pt presents for right TKA with PMH: enlarged prostate, CKD, HTN, OA.    PT Comments    Patient is progressing well toward mobility goals. Continue to progress as tolerated with anticipated d/c home with HHPT.   Follow Up Recommendations  Home health PT     Equipment Recommendations  None recommended by PT    Recommendations for Other Services       Precautions / Restrictions Precautions Precautions: Knee Precaution Booklet Issued: No Precaution Comments: reviewed proper positioning including use of zero knee foam Restrictions Weight Bearing Restrictions: Yes RLE Weight Bearing: Weight bearing as tolerated    Mobility  Bed Mobility Overal bed mobility: Modified Independent Bed Mobility: Supine to Sit           General bed mobility comments: increased time and effort  Transfers Overall transfer level: Needs assistance Equipment used: Rolling walker (2 wheeled) Transfers: Sit to/from Stand Sit to Stand: Supervision         General transfer comment: cues for hand placement  Ambulation/Gait Ambulation/Gait assistance: Supervision Ambulation Distance (Feet): 200 Feet Assistive device: Rolling walker (2 wheeled) Gait Pattern/deviations: Step-through pattern;Decreased stance time - right;Decreased stride length;Decreased weight shift to right     General Gait Details: cues for R heel strike and increased knee flexion during swing phase   Stairs            Wheelchair Mobility    Modified Rankin (Stroke Patients Only)       Balance                                    Cognition Arousal/Alertness: Awake/alert Behavior During Therapy: WFL for tasks assessed/performed Overall Cognitive Status: Within Functional Limits for tasks assessed                      Exercises Total  Joint Exercises Quad Sets: AROM;Both;10 reps;Supine Heel Slides: AROM;Right;10 reps;Supine Straight Leg Raises: AROM;Right;Supine;10 reps Long Arc Quad: AROM;Right;10 reps;Seated Goniometric ROM: 5-92    General Comments        Pertinent Vitals/Pain Pain Assessment: 0-10 Pain Score: 5  Pain Location: R knee Pain Descriptors / Indicators: Aching;Sore Pain Intervention(s): Limited activity within patient's tolerance;Monitored during session;Premedicated before session;Repositioned    Home Living                      Prior Function            PT Goals (current goals can now be found in the care plan section) Acute Rehab PT Goals Patient Stated Goal: return to working out PT Goal Formulation: With patient Time For Goal Achievement: 04/28/16 Potential to Achieve Goals: Good Progress towards PT goals: Progressing toward goals    Frequency    7X/week      PT Plan Current plan remains appropriate    Co-evaluation             End of Session Equipment Utilized During Treatment: Gait belt Activity Tolerance: Patient tolerated treatment well Patient left: in chair;with call bell/phone within reach;with family/visitor present     Time: 1134-1202 PT Time Calculation (min) (ACUTE ONLY): 28 min  Charges:  $Gait Training: 8-22 mins $Therapeutic Exercise: 8-22 mins  G Codes:      Salina April, PTA Pager: [phone]   04/22/2016, 1:28 PM

## 2016-04-22 NOTE — Evaluation (Signed)
Occupational Therapy Evaluation and Discharge Patient Details Name: Douglas Huffman MRN: [id number] DOB: 28-Apr-1933 Today's Date: 04/22/2016    History of Present Illness Pt presents for right TKA with PMH: enlarged prostate, CKD, HTN, OA.   Clinical Impression   This 82 yo male admitted and underwent above presents to acute OT with all education completed with pt and wife, we will D/C from acute OT.    Follow Up Recommendations  No OT follow up;Supervision - Intermittent    Equipment Recommendations  None recommended by OT       Precautions / Restrictions Precautions Precautions: Knee Precaution Booklet Issued: No Precaution Comments: reviewed proper positioning including use of zero knee foam Restrictions Weight Bearing Restrictions: No RLE Weight Bearing: Weight bearing as tolerated      Mobility Bed Mobility           General bed mobility comments: Pt up in recliner upon arrival  Transfers Overall transfer level: Needs assistance Equipment used: Rolling walker (2 wheeled) Transfers: Sit to/from Stand Sit to Stand: Supervision                 ADL Overall ADL's : Needs assistance/impaired Eating/Feeding: Independent;Sitting   Grooming: Set up;Sitting   Upper Body Bathing: Set up;Sitting   Lower Body Bathing: Supervison/ safety;Sit to/from stand   Upper Body Dressing : Set up;Sitting   Lower Body Dressing: Supervision/safety;Sit to/from stand   Toilet Transfer: Supervision/safety;Ambulation;RW Toilet Transfer Details (indicate cue type and reason): recliner>bathroom for shower stall transfer>recliner Toileting- Clothing Manipulation and Hygiene: Supervision/safety;Sit to/from stand   Tub/ Shower Transfer: Walk-in shower;Supervision/safety;Ambulation;Rolling walker;Shower seat                     Pertinent Vitals/Pain Pain Assessment: 0-10 Pain Score: 2  Pain Location: right knee Pain Descriptors / Indicators: Sore Pain  Intervention(s): Monitored during session;Repositioned     Hand Dominance Right   Extremity/Trunk Assessment Upper Extremity Assessment Upper Extremity Assessment: Overall WFL for tasks assessed           Communication Communication Communication: No difficulties   Cognition Arousal/Alertness: Awake/alert Behavior During Therapy: WFL for tasks assessed/performed Overall Cognitive Status: Within Functional Limits for tasks assessed                                Home Living Family/patient expects to be discharged to:: Private residence Living Arrangements: Spouse/significant other Available Help at Discharge: Family;Available 24 hours/day Type of Home: Independent living facility Home Access: Level entry     Home Layout: One level     Bathroom Shower/Tub: Walk-in shower;Curtain   Bathroom Toilet: Handicapped height     Home Equipment: Walker - standard;Shower seat - built in;Grab bars - toilet;Grab bars - tub/shower;Hand held shower head   Additional Comments: pt lives at [facility]      Prior Functioning/Environment Level of Independence: Independent        Comments: pt works out in gym everyday, very active             Advertising account planner goals can be found in the care plan section) Acute Rehab OT Goals Patient Stated Goal: home hopefully tomorrow  OT Frequency:                End of Session Equipment Utilized During Treatment: Rolling walker  Activity Tolerance: Patient tolerated treatment well Patient left: in chair;with call bell/phone within reach;with family/visitor present  Time: [id number] OT Time Calculation (min): 20 min Charges:  OT General Charges $OT Visit: 1 Procedure OT Evaluation $OT Eval Moderate Complexity: 1 Procedure  Douglas Huffman [id number] 04/22/2016, 2:19 PM

## 2016-04-22 NOTE — Progress Notes (Signed)
Orthopedic Tech Progress Note Patient Details:  Douglas Huffman 1932/08/15 [id number]  Patient ID: Douglas Huffman, male   DOB: [birth year], 82 y.o.   MRN: [id number] Applied CPM at 0-70.  Kristopher Oppenheim 04/22/2016, 6:56 AM

## 2016-04-23 LAB — CBC
HEMATOCRIT: 38.4 % — AB (ref 39.0–52.0)
Hemoglobin: 13 g/dL (ref 13.0–17.0)
MCH: 30.2 pg (ref 26.0–34.0)
MCHC: 33.9 g/dL (ref 30.0–36.0)
MCV: 89.1 fL (ref 78.0–100.0)
Platelets: 169 10*3/uL (ref 150–400)
RBC: 4.31 MIL/uL (ref 4.22–5.81)
RDW: 13.1 % (ref 11.5–15.5)
WBC: 14.6 10*3/uL — ABNORMAL HIGH (ref 4.0–10.5)

## 2016-04-23 NOTE — Progress Notes (Signed)
Physical Therapy Treatment Patient Details Name: Douglas Huffman MRN: [id number] DOB: 1932/07/03 Today's Date: 04/23/2016    History of Present Illness Pt presents for right TKA with PMH: enlarged prostate, CKD, HTN, OA.    PT Comments    Current plan remains appropriate.   Follow Up Recommendations  Home health PT     Equipment Recommendations  None recommended by PT    Recommendations for Other Services       Precautions / Restrictions Precautions Precautions: Knee Precaution Booklet Issued: No Precaution Comments: pt has good understanding of precuations Restrictions Weight Bearing Restrictions: Yes RLE Weight Bearing: Weight bearing as tolerated    Mobility  Bed Mobility               General bed mobility comments: pt OOB in chair upon arrival  Transfers Overall transfer level: Needs assistance Equipment used: Rolling walker (2 wheeled) Transfers: Sit to/from Stand Sit to Stand: Supervision         General transfer comment: safe hand placement  Ambulation/Gait Ambulation/Gait assistance: Supervision Ambulation Distance (Feet): 300 Feet Assistive device: Rolling walker (2 wheeled) Gait Pattern/deviations: Step-through pattern     General Gait Details: cues for step length symmetry initially with improvement noted and cues for R heel strike; steady gait   Stairs Stairs: Yes Stairs assistance: Min guard Stair Management: Forwards;Two rails;Step to pattern Number of Stairs: 2 General stair comments: cues for sequencing and technique with demonstration by therapist; pt with difficulty recalling sequencing; discussed with wife  Wheelchair Mobility    Modified Rankin (Stroke Patients Only)       Balance                                    Cognition Arousal/Alertness: Awake/alert Behavior During Therapy: WFL for tasks assessed/performed Overall Cognitive Status: Within Functional Limits for tasks assessed                       Exercises      General Comments        Pertinent Vitals/Pain Pain Assessment: 0-10 Pain Score: 2  Pain Location: R knee Pain Descriptors / Indicators: Sore Pain Intervention(s): Limited activity within patient's tolerance;Monitored during session;Repositioned;Patient requesting pain meds-RN notified    Home Living                      Prior Function            PT Goals (current goals can now be found in the care plan section) Acute Rehab PT Goals Patient Stated Goal: go home PT Goal Formulation: With patient Time For Goal Achievement: 04/28/16 Potential to Achieve Goals: Good Progress towards PT goals: Progressing toward goals    Frequency    7X/week      PT Plan Current plan remains appropriate    Co-evaluation             End of Session Equipment Utilized During Treatment: Gait belt Activity Tolerance: Patient tolerated treatment well Patient left: in chair;with call bell/phone within reach;with family/visitor present     Time: 1250-1305 PT Time Calculation (min) (ACUTE ONLY): 15 min  Charges:  $Gait Training: 8-22 mins                    G Codes:      Salina April, PTA Pager: [phone]  04/23/2016, 1:10 PM

## 2016-04-23 NOTE — Progress Notes (Signed)
Physical Therapy Treatment Patient Details Name: Douglas Huffman MRN: [id number] DOB: Mar 10, 1933 Today's Date: 04/23/2016    History of Present Illness Pt presents for right TKA with PMH: enlarged prostate, CKD, HTN, OA.    PT Comments    Patient is making good progress with PT.  From a mobility standpoint anticipate patient will be ready for DC home when medically ready.     Follow Up Recommendations  Home health PT     Equipment Recommendations  None recommended by PT    Recommendations for Other Services       Precautions / Restrictions Precautions Precautions: Knee Precaution Booklet Issued: No Precaution Comments: pt has good understanding of precuations Restrictions Weight Bearing Restrictions: Yes RLE Weight Bearing: Weight bearing as tolerated    Mobility  Bed Mobility Overal bed mobility: Modified Independent Bed Mobility: Supine to Sit              Transfers Overall transfer level: Needs assistance Equipment used: Rolling walker (2 wheeled) Transfers: Sit to/from Stand Sit to Stand: Supervision         General transfer comment: cues for safe use of AD when going to sit down; supervision for safety  Ambulation/Gait Ambulation/Gait assistance: Supervision Ambulation Distance (Feet): 300 Feet Assistive device: Rolling walker (2 wheeled) Gait Pattern/deviations: Step-through pattern     General Gait Details: cues for posture intially; pt with improved ability to WB on R LE and with improved extension/flexion during gait   Stairs Stairs: Yes Stairs assistance: Min guard Stair Management: Two rails;Forwards;Step to pattern Number of Stairs: 2 General stair comments: cues for sequencing and technique; carry over demonstrated  Wheelchair Mobility    Modified Rankin (Stroke Patients Only)       Balance                                    Cognition Arousal/Alertness: Awake/alert Behavior During Therapy: WFL for tasks  assessed/performed Overall Cognitive Status: Within Functional Limits for tasks assessed                      Exercises Total Joint Exercises Quad Sets: AROM;Right;15 reps;Supine Heel Slides: AROM;Right;15 reps;Supine Hip ABduction/ADduction: AROM;Right;15 reps;Supine Straight Leg Raises: AROM;Right;15 reps;Supine Long Arc Quad: AROM;Right;15 reps;Seated Goniometric ROM: 0-86    General Comments        Pertinent Vitals/Pain Pain Assessment: 0-10 Pain Score: 2  Pain Location: R knee Pain Descriptors / Indicators: Sore Pain Intervention(s): Limited activity within patient's tolerance;Monitored during session;Premedicated before session;Repositioned    Home Living                      Prior Function            PT Goals (current goals can now be found in the care plan section) Acute Rehab PT Goals Patient Stated Goal: go home PT Goal Formulation: With patient Time For Goal Achievement: 04/28/16 Potential to Achieve Goals: Good Progress towards PT goals: Progressing toward goals    Frequency    7X/week      PT Plan Current plan remains appropriate    Co-evaluation             End of Session Equipment Utilized During Treatment: Gait belt Activity Tolerance: Patient tolerated treatment well Patient left: in chair;with call bell/phone within reach;with family/visitor present     Time: [id number] PT Time Calculation (min) (ACUTE  ONLY): 23 min  Charges:  $Gait Training: 8-22 mins $Therapeutic Exercise: 8-22 mins                    G Codes:      Salina April, PTA Pager: [phone]   04/23/2016, 9:11 AM

## 2016-04-23 NOTE — Discharge Summary (Signed)
SPORTS MEDICINE & JOINT REPLACEMENT   Lara Mulch, MD   Carlyon Shadow, PA-C [address], [place], [place]  [address]                             [phone]  PATIENT ID: YULIAN DOVER        MRN:  [id number]          DOB/AGE: [birth year] / 80 y.o.    DISCHARGE SUMMARY  ADMISSION DATE:    04/21/2016 DISCHARGE DATE:   04/23/2016   ADMISSION DIAGNOSIS: primary osteoarthritis right knee    DISCHARGE DIAGNOSIS:  primary osteoarthritis right knee    ADDITIONAL DIAGNOSIS: Active Problems:   S/P total knee replacement  Past Medical History:  Diagnosis Date  . CKD (chronic kidney disease), stage III   . Enlarged prostate   . Hypertension   . Pre-diabetes     PROCEDURE: Procedure(s): Right TOTAL KNEE ARTHROPLASTY on 04/21/2016  CONSULTS:    HISTORY:  See H&P in chart  HOSPITAL COURSE:  Rodey is a 82 y.o. admitted on 04/21/2016 and found to have a diagnosis of primary osteoarthritis right knee.  After appropriate laboratory studies were obtained  they were taken to the operating room on 04/21/2016 and underwent Procedure(s): Right TOTAL KNEE ARTHROPLASTY.   They were given perioperative antibiotics:  Anti-infectives    Start     Dose/Rate Route Frequency Ordered Stop   04/21/16 1230  ceFAZolin (ANCEF) IVPB 1 g/50 mL premix     1 g 100 mL/hr over 30 Minutes Intravenous Every 6 hours 04/21/16 1125 04/21/16 1730   04/21/16 0536  ceFAZolin (ANCEF) IVPB 2g/100 mL premix     2 g 200 mL/hr over 30 Minutes Intravenous On call to O.R. 04/21/16 0536 04/21/16 0730    .  Patient given tranexamic acid IV or topical and exparel intra-operatively.  Tolerated the procedure well.    POD# 1: Vital signs were stable.  Patient denied Chest pain, shortness of breath, or calf pain.  Patient was started on Lovenox 30 mg subcutaneously twice daily at 8am.  Consults to PT, OT, and care management were made.  The patient was weight bearing as tolerated.  CPM was placed on the operative  leg 0-90 degrees for 6-8 hours a day. When out of the CPM, patient was placed in the foam block to achieve full extension. Incentive spirometry was taught.  Dressing was changed.       POD #2, Continued  PT for ambulation and exercise program.  IV saline locked.  O2 discontinued.    The remainder of the hospital course was dedicated to ambulation and strengthening.   The patient was discharged on 2 Days Post-Op in  Good condition.  Blood products given:none  DIAGNOSTIC STUDIES: Recent vital signs: Patient Vitals for the past 24 hrs:  BP Temp Temp src Pulse Resp SpO2  04/23/16 1334 (!) 138/49 97.9 F (36.6 C) Oral 64 17 99 %  04/23/16 0419 (!) 143/72 97.1 F (36.2 C) Oral (!) 52 16 97 %  04/22/16 2005 (!) 146/55 97.7 F (36.5 C) Oral (!) 49 16 97 %       Recent laboratory studies:  Recent Labs  04/22/16 0603 04/23/16 0637  WBC 15.1* 14.6*  HGB 13.5 13.0  HCT 40.0 38.4*  PLT 183 169    Recent Labs  04/22/16 0603  NA 138  K 4.5  CL 109  CO2 24  BUN 20  CREATININE 1.46*  GLUCOSE 140*  CALCIUM 8.6*   Lab Results  Component Value Date   INR 1.08 04/08/2016     Recent Radiographic Studies :  Dg Chest 2 View  Result Date: 04/08/2016 CLINICAL DATA:  Preop testing.  Right knee surgery EXAM: CHEST  2 VIEW COMPARISON:  None. FINDINGS: The heart size and mediastinal contours are within normal limits. Both lungs are clear. The visualized skeletal structures are unremarkable. IMPRESSION: No active cardiopulmonary disease. Electronically Signed   By: Franchot Gallo M.D.   On: 04/08/2016 10:22    DISCHARGE INSTRUCTIONS: Discharge Instructions    CPM    Complete by:  As directed    Continuous passive motion machine (CPM):      Use the CPM from 0 to 90 for 4-6 hours per day.      You may increase by 10 per day.  You may break it up into 2 or 3 sessions per day.      Use CPM for 2 weeks or until you are told to stop.   Call MD / Call 911    Complete by:  As directed     If you experience chest pain or shortness of breath, CALL 911 and be transported to the hospital emergency room.  If you develope a fever above 101 F, pus (white drainage) or increased drainage or redness at the wound, or calf pain, call your surgeon's office.   Constipation Prevention    Complete by:  As directed    Drink plenty of fluids.  Prune juice may be helpful.  You may use a stool softener, such as Colace (over the counter) 100 mg twice a day.  Use MiraLax (over the counter) for constipation as needed.   Diet - low sodium heart healthy    Complete by:  As directed    Discharge instructions    Complete by:  As directed    INSTRUCTIONS AFTER JOINT REPLACEMENT   Remove items at home which could result in a fall. This includes throw rugs or furniture in walking pathways ICE to the affected joint every three hours while awake for 30 minutes at a time, for at least the first 3-5 days, and then as needed for pain and swelling.  Continue to use ice for pain and swelling. You may notice swelling that will progress down to the foot and ankle.  This is normal after surgery.  Elevate your leg when you are not up walking on it.   Continue to use the breathing machine you got in the hospital (incentive spirometer) which will help keep your temperature down.  It is common for your temperature to cycle up and down following surgery, especially at night when you are not up moving around and exerting yourself.  The breathing machine keeps your lungs expanded and your temperature down.   DIET:  As you were doing prior to hospitalization, we recommend a well-balanced diet.  DRESSING / WOUND CARE / SHOWERING  Keep the surgical dressing until follow up.  The dressing is water proof, so you can shower without any extra covering.  IF THE DRESSING FALLS OFF or the wound gets wet inside, change the dressing with sterile gauze.  Please use good hand washing techniques before changing the dressing.  Do not use any  lotions or creams on the incision until instructed by your surgeon.    ACTIVITY  Increase activity slowly as tolerated, but follow the weight bearing instructions below.  No driving for 6 weeks or until further direction given by your physician.  You cannot drive while taking narcotics.  No lifting or carrying greater than 10 lbs. until further directed by your surgeon. Avoid periods of inactivity such as sitting longer than an hour when not asleep. This helps prevent blood clots.  You may return to work once you are authorized by your doctor.     WEIGHT BEARING   Weight bearing as tolerated with assist device (walker, cane, etc) as directed, use it as long as suggested by your surgeon or therapist, typically at least 4-6 weeks.   EXERCISES  Results after joint replacement surgery are often greatly improved when you follow the exercise, range of motion and muscle strengthening exercises prescribed by your doctor. Safety measures are also important to protect the joint from further injury. Any time any of these exercises cause you to have increased pain or swelling, decrease what you are doing until you are comfortable again and then slowly increase them. If you have problems or questions, call your caregiver or physical therapist for advice.   Rehabilitation is important following a joint replacement. After just a few days of immobilization, the muscles of the leg can become weakened and shrink (atrophy).  These exercises are designed to build up the tone and strength of the thigh and leg muscles and to improve motion. Often times heat used for twenty to thirty minutes before working out will loosen up your tissues and help with improving the range of motion but do not use heat for the first two weeks following surgery (sometimes heat can increase post-operative swelling).   These exercises can be done on a training (exercise) mat, on the floor, on a table or on a bed. Use whatever works the  best and is most comfortable for you.    Use music or television while you are exercising so that the exercises are a pleasant break in your day. This will make your life better with the exercises acting as a break in your routine that you can look forward to.   Perform all exercises about fifteen times, three times per day or as directed.  You should exercise both the operative leg and the other leg as well.   Exercises include:   Quad Sets - Tighten up the muscle on the front of the thigh (Quad) and hold for 5-10 seconds.   Straight Leg Raises - With your knee straight (if you were given a brace, keep it on), lift the leg to 60 degrees, hold for 3 seconds, and slowly lower the leg.  Perform this exercise against resistance later as your leg gets stronger.  Leg Slides: Lying on your back, slowly slide your foot toward your buttocks, bending your knee up off the floor (only go as far as is comfortable). Then slowly slide your foot back down until your leg is flat on the floor again.  Angel Wings: Lying on your back spread your legs to the side as far apart as you can without causing discomfort.  Hamstring Strength:  Lying on your back, push your heel against the floor with your leg straight by tightening up the muscles of your buttocks.  Repeat, but this time bend your knee to a comfortable angle, and push your heel against the floor.  You may put a pillow under the heel to make it more comfortable if necessary.   A rehabilitation program following joint replacement surgery can speed recovery and prevent re-injury in  the future due to weakened muscles. Contact your doctor or a physical therapist for more information on knee rehabilitation.    CONSTIPATION  Constipation is defined medically as fewer than three stools per week and severe constipation as less than one stool per week.  Even if you have a regular bowel pattern at home, your normal regimen is likely to be disrupted due to multiple reasons  following surgery.  Combination of anesthesia, postoperative narcotics, change in appetite and fluid intake all can affect your bowels.   YOU MUST use at least one of the following options; they are listed in order of increasing strength to get the job done.  They are all available over the counter, and you may need to use some, POSSIBLY even all of these options:    Drink plenty of fluids (prune juice may be helpful) and high fiber foods Colace 100 mg by mouth twice a day  Senokot for constipation as directed and as needed Dulcolax (bisacodyl), take with full glass of water  Miralax (polyethylene glycol) once or twice a day as needed.  If you have tried all these things and are unable to have a bowel movement in the first 3-4 days after surgery call either your surgeon or your primary doctor.    If you experience loose stools or diarrhea, hold the medications until you stool forms back up.  If your symptoms do not get better within 1 week or if they get worse, check with your doctor.  If you experience "the worst abdominal pain ever" or develop nausea or vomiting, please contact the office immediately for further recommendations for treatment.   ITCHING:  If you experience itching with your medications, try taking only a single pain pill, or even half a pain pill at a time.  You can also use Benadryl over the counter for itching or also to help with sleep.   TED HOSE STOCKINGS:  Use stockings on both legs until for at least 2 weeks or as directed by physician office. They may be removed at night for sleeping.  MEDICATIONS:  See your medication summary on the "After Visit Summary" that nursing will review with you.  You may have some home medications which will be placed on hold until you complete the course of blood thinner medication.  It is important for you to complete the blood thinner medication as prescribed.  PRECAUTIONS:  If you experience chest pain or shortness of breath - call 911  immediately for transfer to the hospital emergency department.   If you develop a fever greater that 101 F, purulent drainage from wound, increased redness or drainage from wound, foul odor from the wound/dressing, or calf pain - CONTACT YOUR SURGEON.                                                   FOLLOW-UP APPOINTMENTS:  If you do not already have a post-op appointment, please call the office for an appointment to be seen by your surgeon.  Guidelines for how soon to be seen are listed in your "After Visit Summary", but are typically between 1-4 weeks after surgery.  OTHER INSTRUCTIONS:   Knee Replacement:  Do not place pillow under knee, focus on keeping the knee straight while resting. CPM instructions: 0-90 degrees, 2 hours in the morning, 2 hours in the afternoon,  and 2 hours in the evening. Place foam block, curve side up under heel at all times except when in CPM or when walking.  DO NOT modify, tear, cut, or change the foam block in any way.  MAKE SURE YOU:  Understand these instructions.  Get help right away if you are not doing well or get worse.    Thank you for letting us be a part of your medical care team.  It is a privilege we respect greatly.  We hope these instructions will help you stay on track for a fast and full recovery!   Increase activity slowly as tolerated    Complete by:  As directed       DISCHARGE MEDICATIONS:     Medication List    STOP taking these medications   aspirin 325 MG tablet Replaced by:  aspirin 325 MG EC tablet   ibuprofen 200 MG tablet Commonly known as:  ADVIL,MOTRIN     TAKE these medications   aspirin 325 MG EC tablet Take 1 tablet (325 mg total) by mouth 2 (two) times daily. Replaces:  aspirin 325 MG tablet   dutasteride 0.5 MG capsule Commonly known as:  AVODART Take 0.5 mg by mouth daily.   losartan 100 MG tablet Commonly known as:  COZAAR Take 100 mg by mouth daily.   methocarbamol 500 MG tablet Commonly known as:   ROBAXIN Take 1-2 tablets (500-1,000 mg total) by mouth every 6 (six) hours as needed for muscle spasms.   oxyCODONE 5 MG immediate release tablet Commonly known as:  Oxy IR/ROXICODONE Take 1-2 tablets (5-10 mg total) by mouth every 3 (three) hours as needed for breakthrough pain.   oxymetazoline 0.05 % nasal spray Commonly known as:  AFRIN Place 1 spray into both nostrils at bedtime.   tamsulosin 0.4 MG Caps capsule Commonly known as:  FLOMAX Take 0.4 mg by mouth daily after supper.       FOLLOW UP VISIT:   Follow-up Information    [facility] .   Specialty:  [place] Why:  Someone from [facility] will contact you to arrange start date and time for therapy. Contact information: [address] [place] [address] [phone]           DISPOSITION: HOME VS. SNF  CONDITION:  Good   Donia Ast 04/23/2016, 2:53 PM

## 2016-04-23 NOTE — Progress Notes (Signed)
D/c instructions reviewed with pt and wife. Copy of instructions and scripts given to pt. Pt d/c'd via wheelchair with belongings, escorted by unit NT.

## 2016-04-23 NOTE — Care Management Note (Signed)
Case Management Note  Patient Details  Name: Douglas Huffman MRN: [id number] Date of Birth: 10/24/1932  Subjective/Objective:    82 yr old gentleman s/p right total knee arthroplasty.                 Action/Plan: Case manager spoke with patient and wife concerning [place] and DME needs. Patient was preoperatively setup with [facility], no changes. RW, 3in1 have been delivered to patient's home, CPM will be delivered this evening.  Expected Discharge Date:   04/23/16               Expected Discharge Plan:  [place]  In-House Referral:     Discharge planning Services  CM Consult  Post Acute Care Choice:  Durable Medical Equipment, Home Health Choice offered to:  Patient  DME Arranged:  3-N-1, CPM, [place] rolling DME Agency:  [facility]  HH Arranged:  PT [place] Agency:  [facility] (now [facility])  Status of Service:  Completed, signed off  If discussed at [employer] of Stay Meetings, dates discussed:    Additional Comments:  Ninfa Meeker, RN 04/23/2016, 2:52 PM

## 2016-04-23 NOTE — Progress Notes (Signed)
Orthopedic Tech Progress Note Patient Details:  Douglas Huffman 11-17-1932 [id number]  Patient ID: Douglas Huffman, male   DOB: 08-27-1932, 82 y.o.   MRN: [id number] Applied cpm 0-90  Douglas Huffman 04/23/2016, 6:05 AM

## 2017-06-24 IMAGING — CR DG CHEST 2V
2 series · 2 of 2 positions shown · non-contrast
Comparison: None.

CLINICAL DATA: Preop testing.  Right knee surgery

EXAM:
CHEST  2 VIEW

[w chest pa]
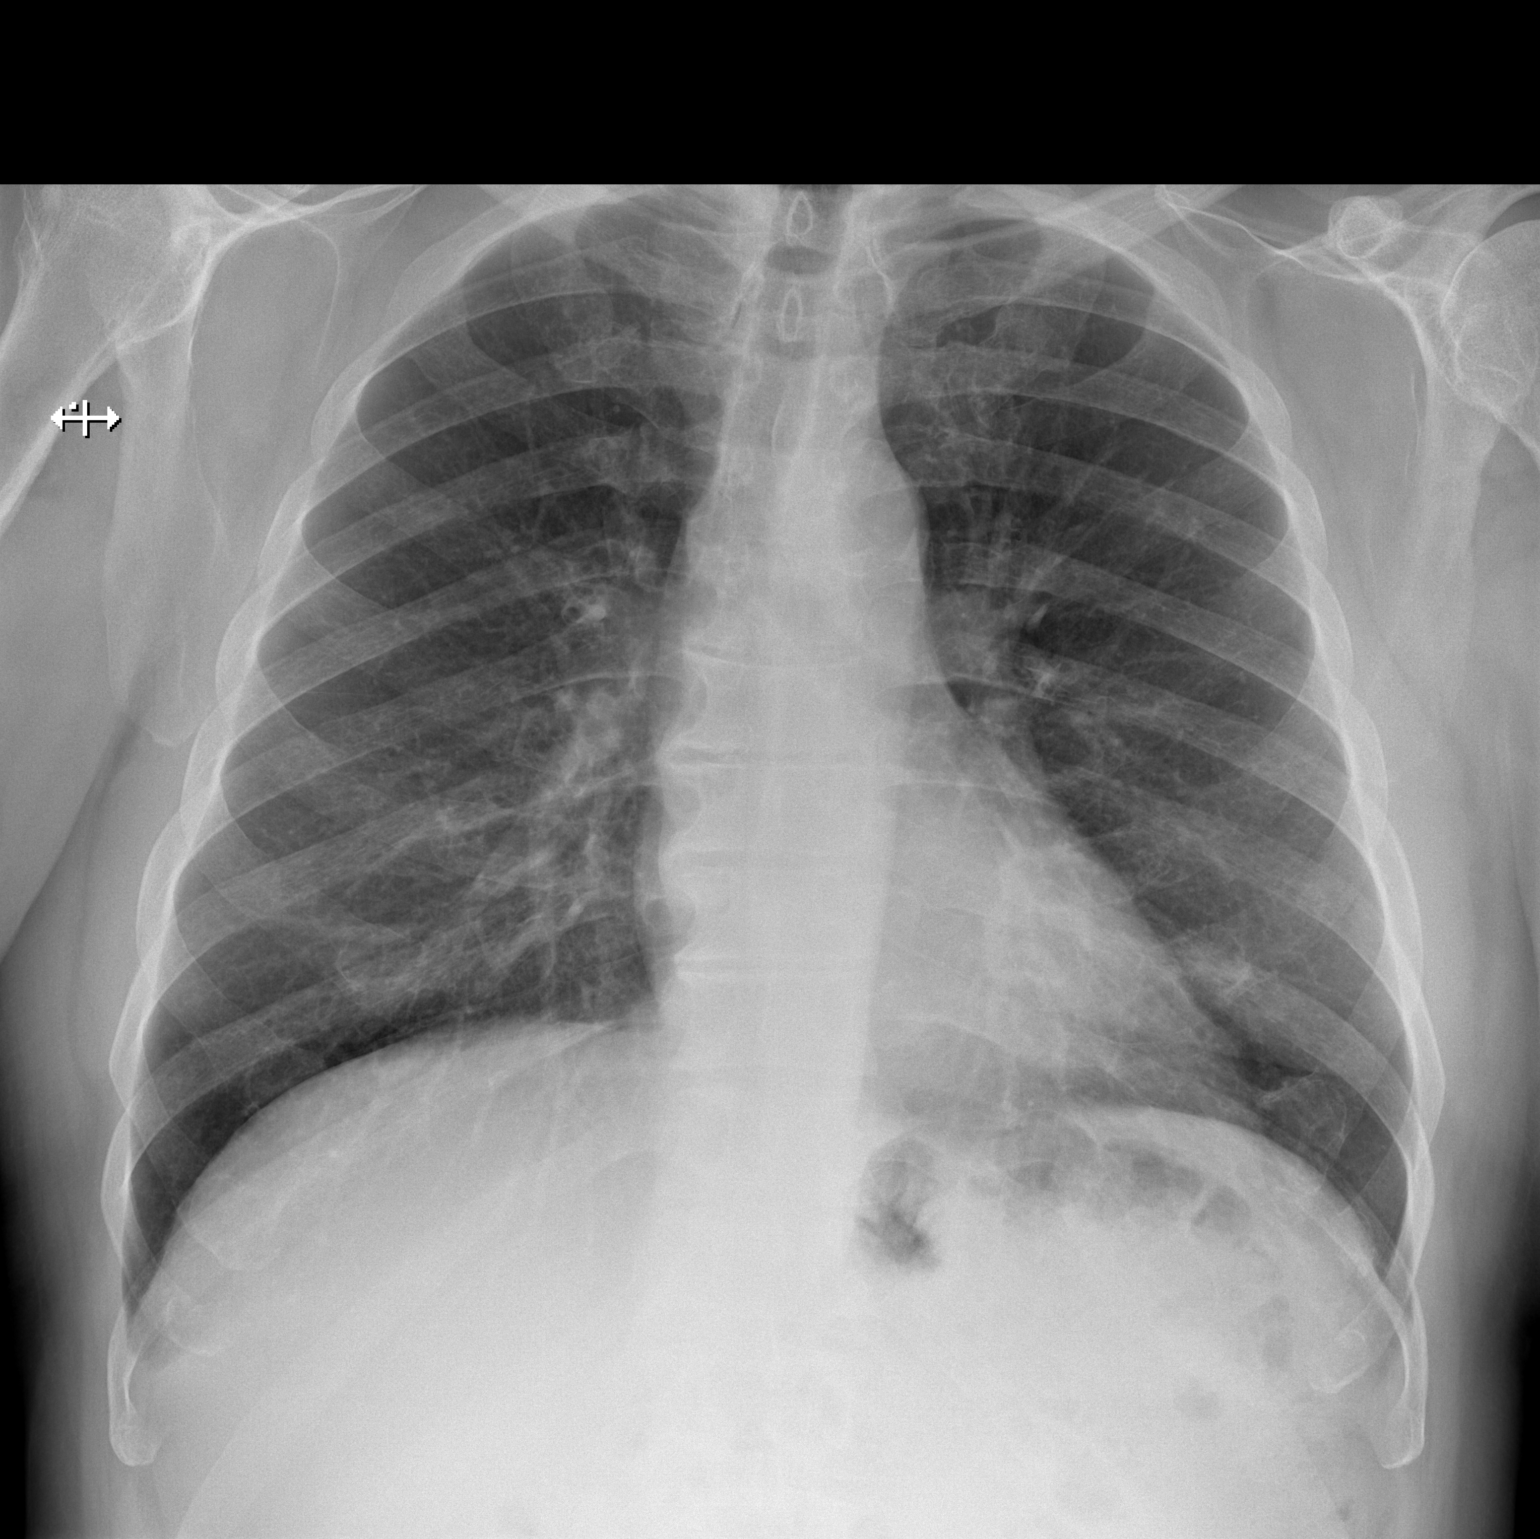

[w chest lat]
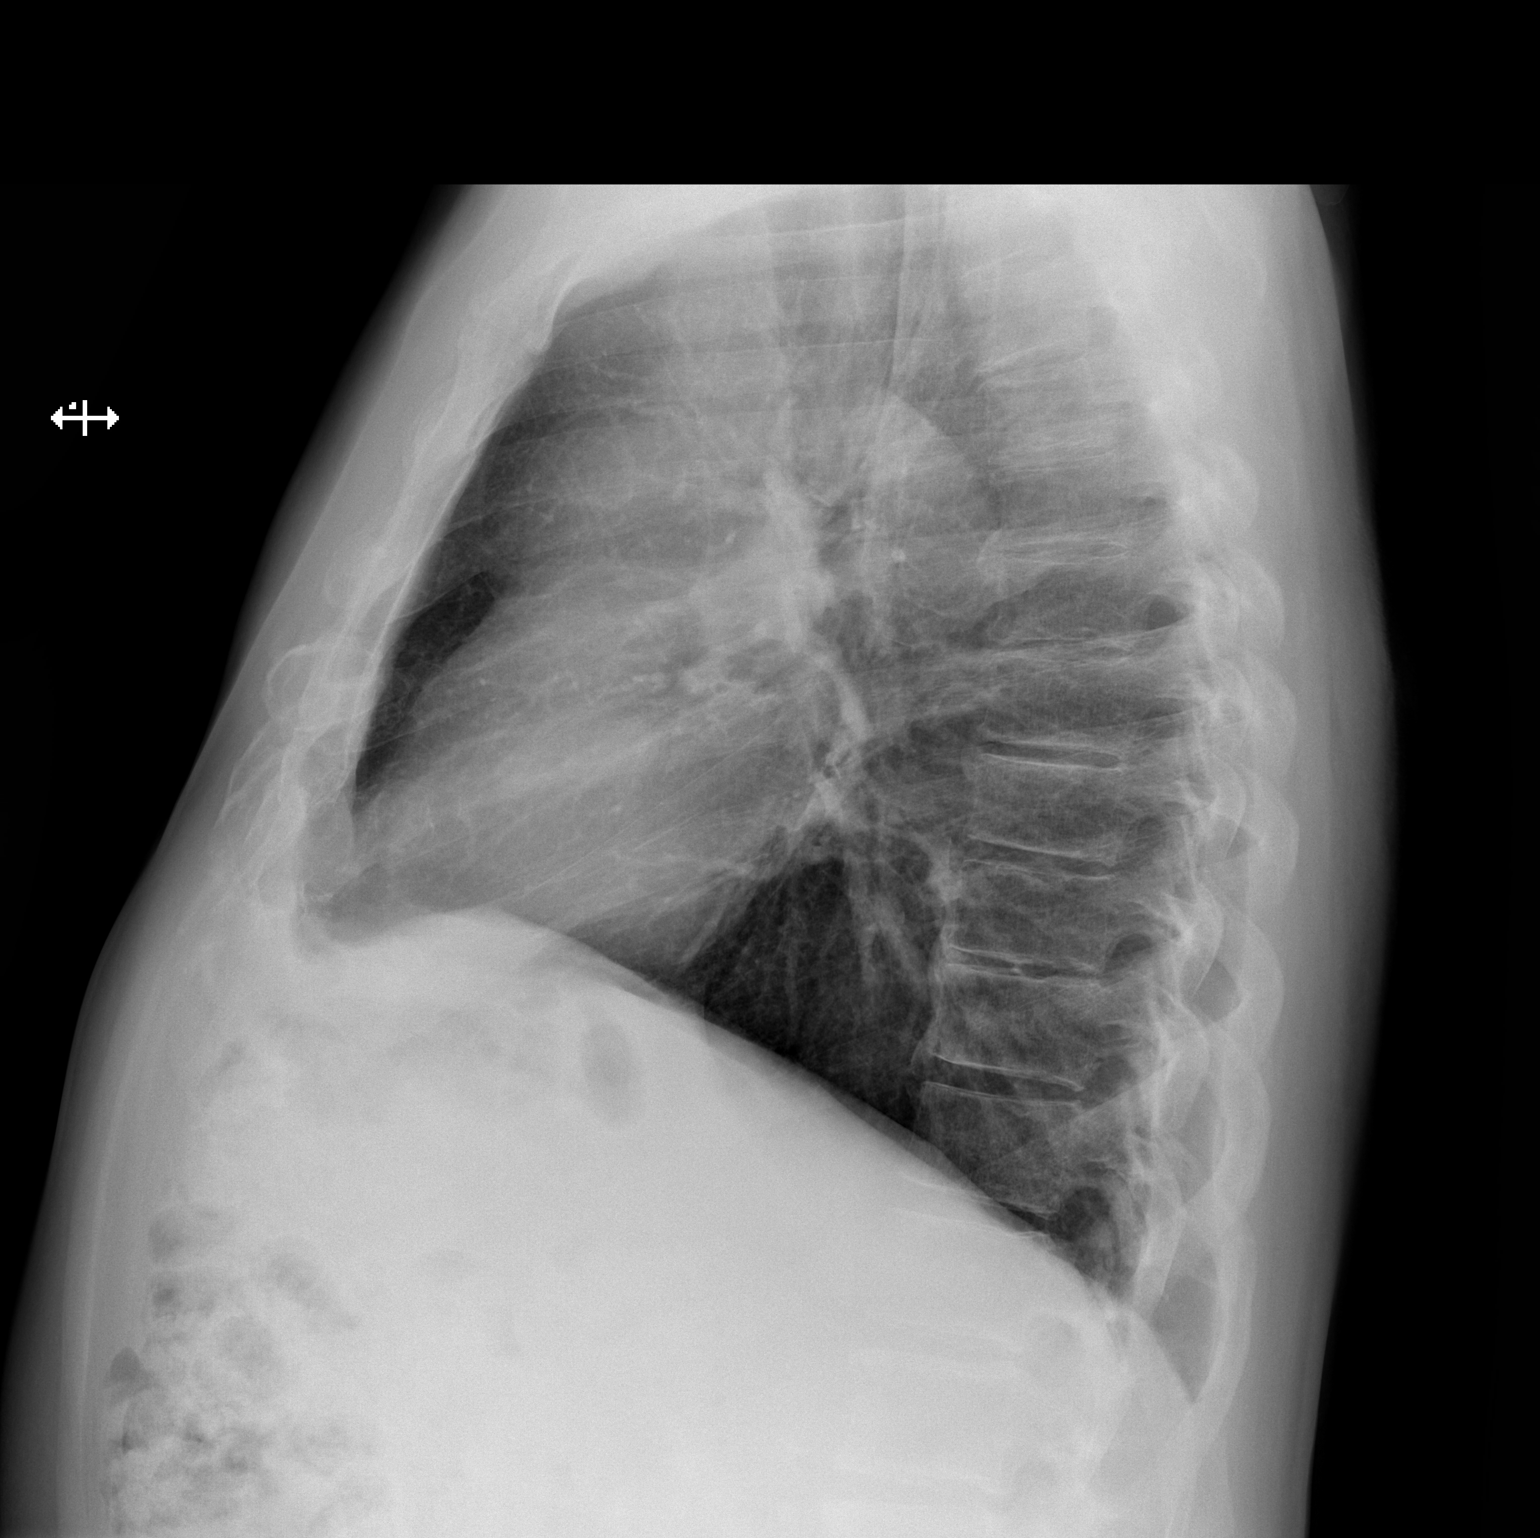

[2 of 2 positions shown; findings below may reference images not displayed]

FINDINGS: The heart size and mediastinal contours are within normal limits.
Both lungs are clear. The visualized skeletal structures are
unremarkable.
IMPRESSION: No active cardiopulmonary disease.

## 2021-08-05 DIAGNOSIS — D2262 Melanocytic nevi of left upper limb, including shoulder: Secondary | ICD-10-CM | POA: Diagnosis not present

## 2021-08-05 DIAGNOSIS — L820 Inflamed seborrheic keratosis: Secondary | ICD-10-CM | POA: Diagnosis not present

## 2021-08-05 DIAGNOSIS — L821 Other seborrheic keratosis: Secondary | ICD-10-CM | POA: Diagnosis not present

## 2021-08-05 DIAGNOSIS — D225 Melanocytic nevi of trunk: Secondary | ICD-10-CM | POA: Diagnosis not present

## 2021-08-05 DIAGNOSIS — D3617 Benign neoplasm of peripheral nerves and autonomic nervous system of trunk, unspecified: Secondary | ICD-10-CM | POA: Diagnosis not present

## 2021-08-05 DIAGNOSIS — D692 Other nonthrombocytopenic purpura: Secondary | ICD-10-CM | POA: Diagnosis not present

## 2021-08-05 DIAGNOSIS — L814 Other melanin hyperpigmentation: Secondary | ICD-10-CM | POA: Diagnosis not present

## 2021-08-05 DIAGNOSIS — M795 Residual foreign body in soft tissue: Secondary | ICD-10-CM | POA: Diagnosis not present

## 2021-08-05 DIAGNOSIS — L570 Actinic keratosis: Secondary | ICD-10-CM | POA: Diagnosis not present

## 2021-08-05 DIAGNOSIS — D3611 Benign neoplasm of peripheral nerves and autonomic nervous system of face, head, and neck: Secondary | ICD-10-CM | POA: Diagnosis not present

## 2021-08-05 DIAGNOSIS — D1801 Hemangioma of skin and subcutaneous tissue: Secondary | ICD-10-CM | POA: Diagnosis not present

## 2021-09-20 DIAGNOSIS — N401 Enlarged prostate with lower urinary tract symptoms: Secondary | ICD-10-CM | POA: Diagnosis not present

## 2021-09-20 DIAGNOSIS — R3914 Feeling of incomplete bladder emptying: Secondary | ICD-10-CM | POA: Diagnosis not present

## 2022-03-17 DIAGNOSIS — N401 Enlarged prostate with lower urinary tract symptoms: Secondary | ICD-10-CM | POA: Diagnosis not present

## 2022-03-17 DIAGNOSIS — R3912 Poor urinary stream: Secondary | ICD-10-CM | POA: Diagnosis not present

## 2022-03-17 DIAGNOSIS — R3914 Feeling of incomplete bladder emptying: Secondary | ICD-10-CM | POA: Diagnosis not present

## 2022-05-13 DIAGNOSIS — L570 Actinic keratosis: Secondary | ICD-10-CM | POA: Diagnosis not present

## 2022-05-13 DIAGNOSIS — L820 Inflamed seborrheic keratosis: Secondary | ICD-10-CM | POA: Diagnosis not present

## 2022-06-11 DIAGNOSIS — E119 Type 2 diabetes mellitus without complications: Secondary | ICD-10-CM | POA: Diagnosis not present

## 2022-06-11 DIAGNOSIS — H2513 Age-related nuclear cataract, bilateral: Secondary | ICD-10-CM | POA: Diagnosis not present

## 2022-06-12 DIAGNOSIS — R0982 Postnasal drip: Secondary | ICD-10-CM | POA: Diagnosis not present

## 2022-06-12 DIAGNOSIS — I10 Essential (primary) hypertension: Secondary | ICD-10-CM | POA: Diagnosis not present

## 2022-06-12 DIAGNOSIS — E1122 Type 2 diabetes mellitus with diabetic chronic kidney disease: Secondary | ICD-10-CM | POA: Diagnosis not present

## 2022-06-12 DIAGNOSIS — M179 Osteoarthritis of knee, unspecified: Secondary | ICD-10-CM | POA: Diagnosis not present

## 2022-06-12 DIAGNOSIS — Z1331 Encounter for screening for depression: Secondary | ICD-10-CM | POA: Diagnosis not present

## 2022-06-12 DIAGNOSIS — N1831 Chronic kidney disease, stage 3a: Secondary | ICD-10-CM | POA: Diagnosis not present

## 2022-06-12 DIAGNOSIS — Z0000 Encounter for general adult medical examination without abnormal findings: Secondary | ICD-10-CM | POA: Diagnosis not present

## 2022-06-12 DIAGNOSIS — N400 Enlarged prostate without lower urinary tract symptoms: Secondary | ICD-10-CM | POA: Diagnosis not present

## 2022-06-12 DIAGNOSIS — E7800 Pure hypercholesterolemia, unspecified: Secondary | ICD-10-CM | POA: Diagnosis not present

## 2022-06-12 DIAGNOSIS — D692 Other nonthrombocytopenic purpura: Secondary | ICD-10-CM | POA: Diagnosis not present

## 2022-06-12 DIAGNOSIS — Z23 Encounter for immunization: Secondary | ICD-10-CM | POA: Diagnosis not present

## 2022-10-13 DIAGNOSIS — M179 Osteoarthritis of knee, unspecified: Secondary | ICD-10-CM | POA: Diagnosis not present

## 2022-10-13 DIAGNOSIS — N400 Enlarged prostate without lower urinary tract symptoms: Secondary | ICD-10-CM | POA: Diagnosis not present

## 2022-10-13 DIAGNOSIS — E7800 Pure hypercholesterolemia, unspecified: Secondary | ICD-10-CM | POA: Diagnosis not present

## 2022-10-13 DIAGNOSIS — E1122 Type 2 diabetes mellitus with diabetic chronic kidney disease: Secondary | ICD-10-CM | POA: Diagnosis not present

## 2022-10-13 DIAGNOSIS — N1831 Chronic kidney disease, stage 3a: Secondary | ICD-10-CM | POA: Diagnosis not present

## 2022-10-13 DIAGNOSIS — D692 Other nonthrombocytopenic purpura: Secondary | ICD-10-CM | POA: Diagnosis not present

## 2022-10-13 DIAGNOSIS — I10 Essential (primary) hypertension: Secondary | ICD-10-CM | POA: Diagnosis not present

## 2022-11-12 DIAGNOSIS — N401 Enlarged prostate with lower urinary tract symptoms: Secondary | ICD-10-CM | POA: Diagnosis not present

## 2022-11-12 DIAGNOSIS — R3914 Feeling of incomplete bladder emptying: Secondary | ICD-10-CM | POA: Diagnosis not present

## 2023-11-20 DIAGNOSIS — R3914 Feeling of incomplete bladder emptying: Secondary | ICD-10-CM | POA: Diagnosis not present

## 2023-11-20 DIAGNOSIS — N401 Enlarged prostate with lower urinary tract symptoms: Secondary | ICD-10-CM | POA: Diagnosis not present

## 2023-12-17 DIAGNOSIS — N1831 Chronic kidney disease, stage 3a: Secondary | ICD-10-CM | POA: Diagnosis not present

## 2023-12-17 DIAGNOSIS — I10 Essential (primary) hypertension: Secondary | ICD-10-CM | POA: Diagnosis not present

## 2023-12-17 DIAGNOSIS — E1122 Type 2 diabetes mellitus with diabetic chronic kidney disease: Secondary | ICD-10-CM | POA: Diagnosis not present

## 2023-12-21 DIAGNOSIS — N401 Enlarged prostate with lower urinary tract symptoms: Secondary | ICD-10-CM | POA: Diagnosis not present

## 2024-01-06 DIAGNOSIS — H903 Sensorineural hearing loss, bilateral: Secondary | ICD-10-CM | POA: Diagnosis not present

## 2024-01-15 DIAGNOSIS — N1831 Chronic kidney disease, stage 3a: Secondary | ICD-10-CM | POA: Diagnosis not present

## 2024-01-15 DIAGNOSIS — E1122 Type 2 diabetes mellitus with diabetic chronic kidney disease: Secondary | ICD-10-CM | POA: Diagnosis not present

## 2024-01-15 DIAGNOSIS — I10 Essential (primary) hypertension: Secondary | ICD-10-CM | POA: Diagnosis not present

## 2024-01-21 DIAGNOSIS — I10 Essential (primary) hypertension: Secondary | ICD-10-CM | POA: Diagnosis not present

## 2024-01-21 DIAGNOSIS — E1122 Type 2 diabetes mellitus with diabetic chronic kidney disease: Secondary | ICD-10-CM | POA: Diagnosis not present

## 2024-01-21 DIAGNOSIS — M179 Osteoarthritis of knee, unspecified: Secondary | ICD-10-CM | POA: Diagnosis not present

## 2024-01-21 DIAGNOSIS — N1831 Chronic kidney disease, stage 3a: Secondary | ICD-10-CM | POA: Diagnosis not present

## 2024-01-21 DIAGNOSIS — E7800 Pure hypercholesterolemia, unspecified: Secondary | ICD-10-CM | POA: Diagnosis not present

## 2024-01-21 DIAGNOSIS — N400 Enlarged prostate without lower urinary tract symptoms: Secondary | ICD-10-CM | POA: Diagnosis not present

## 2024-01-26 DIAGNOSIS — L820 Inflamed seborrheic keratosis: Secondary | ICD-10-CM | POA: Diagnosis not present

## 2024-01-26 DIAGNOSIS — L239 Allergic contact dermatitis, unspecified cause: Secondary | ICD-10-CM | POA: Diagnosis not present

## 2024-02-04 DIAGNOSIS — D485 Neoplasm of uncertain behavior of skin: Secondary | ICD-10-CM | POA: Diagnosis not present

## 2024-02-04 DIAGNOSIS — B439 Chromomycosis, unspecified: Secondary | ICD-10-CM | POA: Diagnosis not present

## 2024-02-04 DIAGNOSIS — L308 Other specified dermatitis: Secondary | ICD-10-CM | POA: Diagnosis not present

## 2024-02-04 DIAGNOSIS — L309 Dermatitis, unspecified: Secondary | ICD-10-CM | POA: Diagnosis not present

## 2024-02-04 DIAGNOSIS — D1809 Hemangioma of other sites: Secondary | ICD-10-CM | POA: Diagnosis not present

## 2024-02-11 DIAGNOSIS — L282 Other prurigo: Secondary | ICD-10-CM | POA: Diagnosis not present

## 2024-02-14 DIAGNOSIS — N1831 Chronic kidney disease, stage 3a: Secondary | ICD-10-CM | POA: Diagnosis not present

## 2024-02-14 DIAGNOSIS — E1122 Type 2 diabetes mellitus with diabetic chronic kidney disease: Secondary | ICD-10-CM | POA: Diagnosis not present

## 2024-02-14 DIAGNOSIS — I10 Essential (primary) hypertension: Secondary | ICD-10-CM | POA: Diagnosis not present

## 2024-02-15 DIAGNOSIS — L308 Other specified dermatitis: Secondary | ICD-10-CM | POA: Diagnosis not present

## 2024-02-15 DIAGNOSIS — E875 Hyperkalemia: Secondary | ICD-10-CM | POA: Diagnosis not present

## 2024-02-15 DIAGNOSIS — L608 Other nail disorders: Secondary | ICD-10-CM | POA: Diagnosis not present

## 2024-02-15 DIAGNOSIS — N1832 Chronic kidney disease, stage 3b: Secondary | ICD-10-CM | POA: Diagnosis not present

## 2024-02-21 DIAGNOSIS — E7800 Pure hypercholesterolemia, unspecified: Secondary | ICD-10-CM | POA: Diagnosis not present

## 2024-02-21 DIAGNOSIS — M179 Osteoarthritis of knee, unspecified: Secondary | ICD-10-CM | POA: Diagnosis not present

## 2024-02-21 DIAGNOSIS — I10 Essential (primary) hypertension: Secondary | ICD-10-CM | POA: Diagnosis not present

## 2024-02-21 DIAGNOSIS — E1122 Type 2 diabetes mellitus with diabetic chronic kidney disease: Secondary | ICD-10-CM | POA: Diagnosis not present

## 2024-02-21 DIAGNOSIS — N400 Enlarged prostate without lower urinary tract symptoms: Secondary | ICD-10-CM | POA: Diagnosis not present

## 2024-02-21 DIAGNOSIS — N1831 Chronic kidney disease, stage 3a: Secondary | ICD-10-CM | POA: Diagnosis not present

## 2024-02-24 DIAGNOSIS — E119 Type 2 diabetes mellitus without complications: Secondary | ICD-10-CM | POA: Diagnosis not present

## 2024-02-24 DIAGNOSIS — H2513 Age-related nuclear cataract, bilateral: Secondary | ICD-10-CM | POA: Diagnosis not present

## 2024-02-26 DIAGNOSIS — L400 Psoriasis vulgaris: Secondary | ICD-10-CM | POA: Diagnosis not present

## 2024-02-29 DIAGNOSIS — H109 Unspecified conjunctivitis: Secondary | ICD-10-CM | POA: Diagnosis not present

## 2024-03-15 DIAGNOSIS — E1122 Type 2 diabetes mellitus with diabetic chronic kidney disease: Secondary | ICD-10-CM | POA: Diagnosis not present

## 2024-03-15 DIAGNOSIS — N1831 Chronic kidney disease, stage 3a: Secondary | ICD-10-CM | POA: Diagnosis not present

## 2024-03-15 DIAGNOSIS — I10 Essential (primary) hypertension: Secondary | ICD-10-CM | POA: Diagnosis not present

## 2024-03-22 DIAGNOSIS — E1122 Type 2 diabetes mellitus with diabetic chronic kidney disease: Secondary | ICD-10-CM | POA: Diagnosis not present

## 2024-03-22 DIAGNOSIS — M179 Osteoarthritis of knee, unspecified: Secondary | ICD-10-CM | POA: Diagnosis not present

## 2024-03-22 DIAGNOSIS — N1831 Chronic kidney disease, stage 3a: Secondary | ICD-10-CM | POA: Diagnosis not present

## 2024-03-22 DIAGNOSIS — E7800 Pure hypercholesterolemia, unspecified: Secondary | ICD-10-CM | POA: Diagnosis not present

## 2024-03-22 DIAGNOSIS — N400 Enlarged prostate without lower urinary tract symptoms: Secondary | ICD-10-CM | POA: Diagnosis not present

## 2024-03-22 DIAGNOSIS — I10 Essential (primary) hypertension: Secondary | ICD-10-CM | POA: Diagnosis not present

## 2024-03-23 DIAGNOSIS — N401 Enlarged prostate with lower urinary tract symptoms: Secondary | ICD-10-CM | POA: Diagnosis not present

## 2024-03-29 DIAGNOSIS — H04203 Unspecified epiphora, bilateral lacrimal glands: Secondary | ICD-10-CM | POA: Diagnosis not present

## 2024-03-29 DIAGNOSIS — L309 Dermatitis, unspecified: Secondary | ICD-10-CM | POA: Diagnosis not present

## 2024-03-29 DIAGNOSIS — H029 Unspecified disorder of eyelid: Secondary | ICD-10-CM | POA: Diagnosis not present

## 2024-03-29 DIAGNOSIS — R399 Unspecified symptoms and signs involving the genitourinary system: Secondary | ICD-10-CM | POA: Diagnosis not present

## 2024-03-29 DIAGNOSIS — H02112 Cicatricial ectropion of right lower eyelid: Secondary | ICD-10-CM | POA: Diagnosis not present

## 2024-03-29 DIAGNOSIS — H02106 Unspecified ectropion of left eye, unspecified eyelid: Secondary | ICD-10-CM | POA: Diagnosis not present

## 2024-03-29 DIAGNOSIS — H02103 Unspecified ectropion of right eye, unspecified eyelid: Secondary | ICD-10-CM | POA: Diagnosis not present

## 2024-03-29 DIAGNOSIS — H02115 Cicatricial ectropion of left lower eyelid: Secondary | ICD-10-CM | POA: Diagnosis not present

## 2024-03-29 DIAGNOSIS — Z7901 Long term (current) use of anticoagulants: Secondary | ICD-10-CM | POA: Diagnosis not present

## 2024-03-30 DIAGNOSIS — R3914 Feeling of incomplete bladder emptying: Secondary | ICD-10-CM | POA: Diagnosis not present

## 2024-03-30 DIAGNOSIS — N401 Enlarged prostate with lower urinary tract symptoms: Secondary | ICD-10-CM | POA: Diagnosis not present

## 2024-04-16 DIAGNOSIS — N1831 Chronic kidney disease, stage 3a: Secondary | ICD-10-CM | POA: Diagnosis not present

## 2024-04-16 DIAGNOSIS — I10 Essential (primary) hypertension: Secondary | ICD-10-CM | POA: Diagnosis not present

## 2024-04-16 DIAGNOSIS — E1122 Type 2 diabetes mellitus with diabetic chronic kidney disease: Secondary | ICD-10-CM | POA: Diagnosis not present

## 2024-04-22 DIAGNOSIS — N400 Enlarged prostate without lower urinary tract symptoms: Secondary | ICD-10-CM | POA: Diagnosis not present

## 2024-04-22 DIAGNOSIS — E7800 Pure hypercholesterolemia, unspecified: Secondary | ICD-10-CM | POA: Diagnosis not present

## 2024-04-22 DIAGNOSIS — E1122 Type 2 diabetes mellitus with diabetic chronic kidney disease: Secondary | ICD-10-CM | POA: Diagnosis not present

## 2024-04-22 DIAGNOSIS — N1831 Chronic kidney disease, stage 3a: Secondary | ICD-10-CM | POA: Diagnosis not present

## 2024-04-22 DIAGNOSIS — M179 Osteoarthritis of knee, unspecified: Secondary | ICD-10-CM | POA: Diagnosis not present

## 2024-04-22 DIAGNOSIS — I10 Essential (primary) hypertension: Secondary | ICD-10-CM | POA: Diagnosis not present

## 2024-04-27 DIAGNOSIS — H02106 Unspecified ectropion of left eye, unspecified eyelid: Secondary | ICD-10-CM | POA: Diagnosis not present

## 2024-04-27 DIAGNOSIS — E119 Type 2 diabetes mellitus without complications: Secondary | ICD-10-CM | POA: Diagnosis not present

## 2024-04-27 DIAGNOSIS — H02103 Unspecified ectropion of right eye, unspecified eyelid: Secondary | ICD-10-CM | POA: Diagnosis not present

## 2024-04-27 DIAGNOSIS — H2513 Age-related nuclear cataract, bilateral: Secondary | ICD-10-CM | POA: Diagnosis not present

## 2024-05-16 DIAGNOSIS — I10 Essential (primary) hypertension: Secondary | ICD-10-CM | POA: Diagnosis not present

## 2024-05-16 DIAGNOSIS — N1831 Chronic kidney disease, stage 3a: Secondary | ICD-10-CM | POA: Diagnosis not present

## 2024-05-16 DIAGNOSIS — E1122 Type 2 diabetes mellitus with diabetic chronic kidney disease: Secondary | ICD-10-CM | POA: Diagnosis not present

## 2024-05-22 DIAGNOSIS — E1122 Type 2 diabetes mellitus with diabetic chronic kidney disease: Secondary | ICD-10-CM | POA: Diagnosis not present

## 2024-05-22 DIAGNOSIS — M179 Osteoarthritis of knee, unspecified: Secondary | ICD-10-CM | POA: Diagnosis not present

## 2024-05-22 DIAGNOSIS — N400 Enlarged prostate without lower urinary tract symptoms: Secondary | ICD-10-CM | POA: Diagnosis not present

## 2024-05-22 DIAGNOSIS — E7800 Pure hypercholesterolemia, unspecified: Secondary | ICD-10-CM | POA: Diagnosis not present

## 2024-05-22 DIAGNOSIS — N1831 Chronic kidney disease, stage 3a: Secondary | ICD-10-CM | POA: Diagnosis not present

## 2024-05-22 DIAGNOSIS — I10 Essential (primary) hypertension: Secondary | ICD-10-CM | POA: Diagnosis not present

## 2024-05-27 DIAGNOSIS — L570 Actinic keratosis: Secondary | ICD-10-CM | POA: Diagnosis not present

## 2024-05-27 DIAGNOSIS — L400 Psoriasis vulgaris: Secondary | ICD-10-CM | POA: Diagnosis not present

## 2024-05-27 DIAGNOSIS — B078 Other viral warts: Secondary | ICD-10-CM | POA: Diagnosis not present
